# Patient Record
Sex: Female | Born: 1955 | Race: White | Hispanic: No | Marital: Married | Smoking: Never smoker
Health system: Southern US, Community
[De-identification: ages and names within clinical notes are randomized; demographics above are authoritative.]

## PROBLEM LIST (undated history)

## (undated) DIAGNOSIS — F419 Anxiety disorder, unspecified: Secondary | ICD-10-CM

## (undated) DIAGNOSIS — M8580 Other specified disorders of bone density and structure, unspecified site: Secondary | ICD-10-CM

## (undated) DIAGNOSIS — M1990 Unspecified osteoarthritis, unspecified site: Secondary | ICD-10-CM

## (undated) DIAGNOSIS — R011 Cardiac murmur, unspecified: Secondary | ICD-10-CM

## (undated) HISTORY — DX: Gilbert syndrome: E80.4

## (undated) HISTORY — PX: POLYPECTOMY: SHX149

## (undated) HISTORY — PX: BREAST BIOPSY: SHX20

## (undated) HISTORY — PX: COLONOSCOPY: SHX174

## (undated) HISTORY — DX: Other specified disorders of bone density and structure, unspecified site: M85.80

## (undated) HISTORY — DX: Anxiety disorder, unspecified: F41.9

## (undated) HISTORY — DX: Unspecified osteoarthritis, unspecified site: M19.90

## (undated) HISTORY — DX: Cardiac murmur, unspecified: R01.1

## (undated) HISTORY — PX: KNEE SURGERY: SHX244

---

## 1999-03-01 ENCOUNTER — Other Ambulatory Visit: Admission: RE | Admit: 1999-03-01 | Discharge: 1999-03-01 | Payer: Self-pay | Admitting: Obstetrics & Gynecology

## 2000-07-25 ENCOUNTER — Encounter: Admission: RE | Admit: 2000-07-25 | Discharge: 2000-08-16 | Payer: Self-pay | Admitting: Specialist

## 2000-07-26 ENCOUNTER — Other Ambulatory Visit: Admission: RE | Admit: 2000-07-26 | Discharge: 2000-07-26 | Payer: Self-pay | Admitting: Obstetrics & Gynecology

## 2001-10-02 ENCOUNTER — Other Ambulatory Visit: Admission: RE | Admit: 2001-10-02 | Discharge: 2001-10-02 | Payer: Self-pay | Admitting: Obstetrics & Gynecology

## 2001-10-11 ENCOUNTER — Encounter: Admission: RE | Admit: 2001-10-11 | Discharge: 2001-10-11 | Payer: Self-pay | Admitting: Surgery

## 2001-10-11 ENCOUNTER — Encounter (INDEPENDENT_AMBULATORY_CARE_PROVIDER_SITE_OTHER): Payer: Self-pay | Admitting: *Deleted

## 2001-10-11 ENCOUNTER — Encounter: Payer: Self-pay | Admitting: Surgery

## 2002-01-15 ENCOUNTER — Encounter: Payer: Self-pay | Admitting: Family Medicine

## 2002-01-15 ENCOUNTER — Encounter: Admission: RE | Admit: 2002-01-15 | Discharge: 2002-01-15 | Payer: Self-pay | Admitting: Family Medicine

## 2002-04-15 ENCOUNTER — Encounter: Payer: Self-pay | Admitting: Obstetrics & Gynecology

## 2002-04-15 ENCOUNTER — Encounter: Admission: RE | Admit: 2002-04-15 | Discharge: 2002-04-15 | Payer: Self-pay | Admitting: Obstetrics & Gynecology

## 2002-08-29 ENCOUNTER — Other Ambulatory Visit: Admission: RE | Admit: 2002-08-29 | Discharge: 2002-08-29 | Payer: Self-pay | Admitting: Family Medicine

## 2003-04-20 ENCOUNTER — Encounter: Admission: RE | Admit: 2003-04-20 | Discharge: 2003-04-20 | Payer: Self-pay | Admitting: Obstetrics & Gynecology

## 2003-04-20 ENCOUNTER — Encounter: Payer: Self-pay | Admitting: Obstetrics & Gynecology

## 2003-08-31 ENCOUNTER — Other Ambulatory Visit: Admission: RE | Admit: 2003-08-31 | Discharge: 2003-08-31 | Payer: Self-pay | Admitting: Family Medicine

## 2003-12-02 ENCOUNTER — Other Ambulatory Visit: Admission: RE | Admit: 2003-12-02 | Discharge: 2003-12-02 | Payer: Self-pay | Admitting: Obstetrics & Gynecology

## 2004-04-20 ENCOUNTER — Encounter: Admission: RE | Admit: 2004-04-20 | Discharge: 2004-04-20 | Payer: Self-pay | Admitting: Family Medicine

## 2005-02-16 ENCOUNTER — Other Ambulatory Visit: Admission: RE | Admit: 2005-02-16 | Discharge: 2005-02-16 | Payer: Self-pay | Admitting: Obstetrics and Gynecology

## 2005-03-30 ENCOUNTER — Ambulatory Visit: Payer: Self-pay | Admitting: Family Medicine

## 2005-05-01 ENCOUNTER — Encounter: Admission: RE | Admit: 2005-05-01 | Discharge: 2005-05-01 | Payer: Self-pay | Admitting: Obstetrics and Gynecology

## 2006-03-12 ENCOUNTER — Other Ambulatory Visit: Admission: RE | Admit: 2006-03-12 | Discharge: 2006-03-12 | Payer: Self-pay | Admitting: Obstetrics and Gynecology

## 2006-05-02 ENCOUNTER — Encounter: Admission: RE | Admit: 2006-05-02 | Discharge: 2006-05-02 | Payer: Self-pay | Admitting: Internal Medicine

## 2007-05-06 ENCOUNTER — Encounter: Admission: RE | Admit: 2007-05-06 | Discharge: 2007-05-06 | Payer: Self-pay | Admitting: Internal Medicine

## 2007-05-08 ENCOUNTER — Encounter: Admission: RE | Admit: 2007-05-08 | Discharge: 2007-05-08 | Payer: Self-pay | Admitting: Internal Medicine

## 2007-12-13 ENCOUNTER — Encounter: Admission: RE | Admit: 2007-12-13 | Discharge: 2007-12-13 | Payer: Self-pay | Admitting: Obstetrics and Gynecology

## 2008-05-27 ENCOUNTER — Encounter: Admission: RE | Admit: 2008-05-27 | Discharge: 2008-05-27 | Payer: Self-pay | Admitting: Internal Medicine

## 2008-06-23 ENCOUNTER — Ambulatory Visit: Payer: Self-pay | Admitting: Internal Medicine

## 2008-07-06 ENCOUNTER — Ambulatory Visit: Payer: Self-pay | Admitting: Internal Medicine

## 2009-06-14 ENCOUNTER — Encounter: Admission: RE | Admit: 2009-06-14 | Discharge: 2009-06-14 | Payer: Self-pay | Admitting: Internal Medicine

## 2010-06-15 ENCOUNTER — Encounter: Admission: RE | Admit: 2010-06-15 | Discharge: 2010-06-15 | Payer: Self-pay | Admitting: Internal Medicine

## 2011-01-08 ENCOUNTER — Encounter: Payer: Self-pay | Admitting: Internal Medicine

## 2011-04-04 ENCOUNTER — Other Ambulatory Visit: Payer: Self-pay | Admitting: Internal Medicine

## 2011-04-04 DIAGNOSIS — Z1231 Encounter for screening mammogram for malignant neoplasm of breast: Secondary | ICD-10-CM

## 2011-06-19 ENCOUNTER — Ambulatory Visit
Admission: RE | Admit: 2011-06-19 | Discharge: 2011-06-19 | Disposition: A | Discharge status: Home / Self Care | Payer: BC Managed Care – PPO | Source: Ambulatory Visit | Attending: Internal Medicine | Admitting: Internal Medicine

## 2011-06-19 DIAGNOSIS — Z1231 Encounter for screening mammogram for malignant neoplasm of breast: Secondary | ICD-10-CM

## 2012-04-01 ENCOUNTER — Other Ambulatory Visit: Payer: Self-pay | Admitting: Internal Medicine

## 2012-04-01 DIAGNOSIS — Z1231 Encounter for screening mammogram for malignant neoplasm of breast: Secondary | ICD-10-CM

## 2012-06-19 ENCOUNTER — Ambulatory Visit: Payer: BC Managed Care – PPO

## 2012-06-24 ENCOUNTER — Ambulatory Visit
Admission: RE | Admit: 2012-06-24 | Discharge: 2012-06-24 | Disposition: A | Discharge status: Home / Self Care | Payer: BC Managed Care – PPO | Source: Ambulatory Visit | Attending: Internal Medicine | Admitting: Internal Medicine

## 2012-06-24 DIAGNOSIS — Z1231 Encounter for screening mammogram for malignant neoplasm of breast: Secondary | ICD-10-CM

## 2013-05-19 ENCOUNTER — Other Ambulatory Visit: Payer: Self-pay

## 2013-05-19 DIAGNOSIS — Z1231 Encounter for screening mammogram for malignant neoplasm of breast: Secondary | ICD-10-CM

## 2013-06-25 ENCOUNTER — Ambulatory Visit
Admission: RE | Admit: 2013-06-25 | Discharge: 2013-06-25 | Disposition: A | Discharge status: Home / Self Care | Payer: BC Managed Care – PPO | Source: Ambulatory Visit

## 2013-06-25 DIAGNOSIS — Z1231 Encounter for screening mammogram for malignant neoplasm of breast: Secondary | ICD-10-CM

## 2014-03-31 ENCOUNTER — Other Ambulatory Visit: Payer: Self-pay

## 2014-03-31 DIAGNOSIS — Z1231 Encounter for screening mammogram for malignant neoplasm of breast: Secondary | ICD-10-CM

## 2014-06-29 ENCOUNTER — Ambulatory Visit
Admission: RE | Admit: 2014-06-29 | Discharge: 2014-06-29 | Disposition: A | Discharge status: Home / Self Care | Payer: BC Managed Care – PPO | Source: Ambulatory Visit

## 2014-06-29 DIAGNOSIS — Z1231 Encounter for screening mammogram for malignant neoplasm of breast: Secondary | ICD-10-CM

## 2014-08-25 ENCOUNTER — Encounter: Payer: Self-pay | Admitting: Internal Medicine

## 2015-03-16 ENCOUNTER — Other Ambulatory Visit: Payer: Self-pay

## 2015-03-16 DIAGNOSIS — Z1231 Encounter for screening mammogram for malignant neoplasm of breast: Secondary | ICD-10-CM

## 2015-07-05 ENCOUNTER — Ambulatory Visit: Payer: Self-pay

## 2015-07-06 ENCOUNTER — Ambulatory Visit
Admission: RE | Admit: 2015-07-06 | Discharge: 2015-07-06 | Disposition: A | Discharge status: Home / Self Care | Payer: BLUE CROSS/BLUE SHIELD | Source: Ambulatory Visit

## 2015-07-06 DIAGNOSIS — Z1231 Encounter for screening mammogram for malignant neoplasm of breast: Secondary | ICD-10-CM

## 2016-05-12 DIAGNOSIS — L821 Other seborrheic keratosis: Secondary | ICD-10-CM | POA: Diagnosis not present

## 2016-05-12 DIAGNOSIS — L570 Actinic keratosis: Secondary | ICD-10-CM | POA: Diagnosis not present

## 2016-05-12 DIAGNOSIS — D2261 Melanocytic nevi of right upper limb, including shoulder: Secondary | ICD-10-CM | POA: Diagnosis not present

## 2016-05-12 DIAGNOSIS — D1801 Hemangioma of skin and subcutaneous tissue: Secondary | ICD-10-CM | POA: Diagnosis not present

## 2016-05-12 DIAGNOSIS — L738 Other specified follicular disorders: Secondary | ICD-10-CM | POA: Diagnosis not present

## 2016-05-29 DIAGNOSIS — Z23 Encounter for immunization: Secondary | ICD-10-CM | POA: Diagnosis not present

## 2016-06-21 ENCOUNTER — Other Ambulatory Visit: Payer: Self-pay | Admitting: Obstetrics and Gynecology

## 2016-06-21 DIAGNOSIS — Z1231 Encounter for screening mammogram for malignant neoplasm of breast: Secondary | ICD-10-CM

## 2016-06-29 ENCOUNTER — Other Ambulatory Visit: Payer: Self-pay | Admitting: Obstetrics and Gynecology

## 2016-06-29 DIAGNOSIS — N644 Mastodynia: Secondary | ICD-10-CM

## 2016-07-06 ENCOUNTER — Ambulatory Visit
Admission: RE | Admit: 2016-07-06 | Discharge: 2016-07-06 | Disposition: A | Discharge status: Home / Self Care | Payer: BLUE CROSS/BLUE SHIELD | Source: Ambulatory Visit | Attending: Obstetrics and Gynecology | Admitting: Obstetrics and Gynecology

## 2016-07-06 DIAGNOSIS — N63 Unspecified lump in breast: Secondary | ICD-10-CM | POA: Diagnosis not present

## 2016-07-06 DIAGNOSIS — N644 Mastodynia: Secondary | ICD-10-CM

## 2016-07-06 DIAGNOSIS — N6001 Solitary cyst of right breast: Secondary | ICD-10-CM | POA: Diagnosis not present

## 2016-07-17 ENCOUNTER — Ambulatory Visit: Payer: BLUE CROSS/BLUE SHIELD

## 2016-09-22 DIAGNOSIS — D1801 Hemangioma of skin and subcutaneous tissue: Secondary | ICD-10-CM | POA: Diagnosis not present

## 2016-12-20 DIAGNOSIS — R3129 Other microscopic hematuria: Secondary | ICD-10-CM | POA: Diagnosis not present

## 2016-12-20 DIAGNOSIS — E784 Other hyperlipidemia: Secondary | ICD-10-CM | POA: Diagnosis not present

## 2016-12-20 DIAGNOSIS — M859 Disorder of bone density and structure, unspecified: Secondary | ICD-10-CM | POA: Diagnosis not present

## 2016-12-20 DIAGNOSIS — Z0000 Encounter for general adult medical examination without abnormal findings: Secondary | ICD-10-CM | POA: Diagnosis not present

## 2016-12-25 DIAGNOSIS — E784 Other hyperlipidemia: Secondary | ICD-10-CM | POA: Diagnosis not present

## 2016-12-25 DIAGNOSIS — M859 Disorder of bone density and structure, unspecified: Secondary | ICD-10-CM | POA: Diagnosis not present

## 2016-12-25 DIAGNOSIS — M79671 Pain in right foot: Secondary | ICD-10-CM | POA: Diagnosis not present

## 2016-12-25 DIAGNOSIS — Z0000 Encounter for general adult medical examination without abnormal findings: Secondary | ICD-10-CM | POA: Diagnosis not present

## 2016-12-25 DIAGNOSIS — Z1389 Encounter for screening for other disorder: Secondary | ICD-10-CM | POA: Diagnosis not present

## 2016-12-25 DIAGNOSIS — Z23 Encounter for immunization: Secondary | ICD-10-CM | POA: Diagnosis not present

## 2016-12-25 DIAGNOSIS — R011 Cardiac murmur, unspecified: Secondary | ICD-10-CM | POA: Diagnosis not present

## 2017-01-04 DIAGNOSIS — Z1212 Encounter for screening for malignant neoplasm of rectum: Secondary | ICD-10-CM | POA: Diagnosis not present

## 2017-01-25 DIAGNOSIS — H5203 Hypermetropia, bilateral: Secondary | ICD-10-CM | POA: Diagnosis not present

## 2017-02-08 DIAGNOSIS — Z01419 Encounter for gynecological examination (general) (routine) without abnormal findings: Secondary | ICD-10-CM | POA: Diagnosis not present

## 2017-02-08 DIAGNOSIS — Z6823 Body mass index (BMI) 23.0-23.9, adult: Secondary | ICD-10-CM | POA: Diagnosis not present

## 2017-03-19 DIAGNOSIS — M9903 Segmental and somatic dysfunction of lumbar region: Secondary | ICD-10-CM | POA: Diagnosis not present

## 2017-03-19 DIAGNOSIS — M25552 Pain in left hip: Secondary | ICD-10-CM | POA: Diagnosis not present

## 2017-03-21 DIAGNOSIS — M5137 Other intervertebral disc degeneration, lumbosacral region: Secondary | ICD-10-CM | POA: Diagnosis not present

## 2017-03-21 DIAGNOSIS — M9903 Segmental and somatic dysfunction of lumbar region: Secondary | ICD-10-CM | POA: Diagnosis not present

## 2017-03-21 DIAGNOSIS — M461 Sacroiliitis, not elsewhere classified: Secondary | ICD-10-CM | POA: Diagnosis not present

## 2017-03-21 DIAGNOSIS — M9905 Segmental and somatic dysfunction of pelvic region: Secondary | ICD-10-CM | POA: Diagnosis not present

## 2017-03-22 DIAGNOSIS — M461 Sacroiliitis, not elsewhere classified: Secondary | ICD-10-CM | POA: Diagnosis not present

## 2017-03-22 DIAGNOSIS — M9903 Segmental and somatic dysfunction of lumbar region: Secondary | ICD-10-CM | POA: Diagnosis not present

## 2017-03-22 DIAGNOSIS — M9905 Segmental and somatic dysfunction of pelvic region: Secondary | ICD-10-CM | POA: Diagnosis not present

## 2017-03-22 DIAGNOSIS — M5137 Other intervertebral disc degeneration, lumbosacral region: Secondary | ICD-10-CM | POA: Diagnosis not present

## 2017-03-26 DIAGNOSIS — M461 Sacroiliitis, not elsewhere classified: Secondary | ICD-10-CM | POA: Diagnosis not present

## 2017-03-26 DIAGNOSIS — M5137 Other intervertebral disc degeneration, lumbosacral region: Secondary | ICD-10-CM | POA: Diagnosis not present

## 2017-03-26 DIAGNOSIS — M9903 Segmental and somatic dysfunction of lumbar region: Secondary | ICD-10-CM | POA: Diagnosis not present

## 2017-03-26 DIAGNOSIS — M9905 Segmental and somatic dysfunction of pelvic region: Secondary | ICD-10-CM | POA: Diagnosis not present

## 2017-03-27 DIAGNOSIS — M5137 Other intervertebral disc degeneration, lumbosacral region: Secondary | ICD-10-CM | POA: Diagnosis not present

## 2017-03-27 DIAGNOSIS — M9903 Segmental and somatic dysfunction of lumbar region: Secondary | ICD-10-CM | POA: Diagnosis not present

## 2017-03-27 DIAGNOSIS — M461 Sacroiliitis, not elsewhere classified: Secondary | ICD-10-CM | POA: Diagnosis not present

## 2017-03-27 DIAGNOSIS — M9905 Segmental and somatic dysfunction of pelvic region: Secondary | ICD-10-CM | POA: Diagnosis not present

## 2017-03-29 DIAGNOSIS — M5137 Other intervertebral disc degeneration, lumbosacral region: Secondary | ICD-10-CM | POA: Diagnosis not present

## 2017-03-29 DIAGNOSIS — M461 Sacroiliitis, not elsewhere classified: Secondary | ICD-10-CM | POA: Diagnosis not present

## 2017-03-29 DIAGNOSIS — M9905 Segmental and somatic dysfunction of pelvic region: Secondary | ICD-10-CM | POA: Diagnosis not present

## 2017-03-29 DIAGNOSIS — M9903 Segmental and somatic dysfunction of lumbar region: Secondary | ICD-10-CM | POA: Diagnosis not present

## 2017-04-02 DIAGNOSIS — M5137 Other intervertebral disc degeneration, lumbosacral region: Secondary | ICD-10-CM | POA: Diagnosis not present

## 2017-04-02 DIAGNOSIS — M9905 Segmental and somatic dysfunction of pelvic region: Secondary | ICD-10-CM | POA: Diagnosis not present

## 2017-04-02 DIAGNOSIS — M461 Sacroiliitis, not elsewhere classified: Secondary | ICD-10-CM | POA: Diagnosis not present

## 2017-04-02 DIAGNOSIS — M9903 Segmental and somatic dysfunction of lumbar region: Secondary | ICD-10-CM | POA: Diagnosis not present

## 2017-04-03 DIAGNOSIS — M461 Sacroiliitis, not elsewhere classified: Secondary | ICD-10-CM | POA: Diagnosis not present

## 2017-04-03 DIAGNOSIS — M5137 Other intervertebral disc degeneration, lumbosacral region: Secondary | ICD-10-CM | POA: Diagnosis not present

## 2017-04-03 DIAGNOSIS — M9905 Segmental and somatic dysfunction of pelvic region: Secondary | ICD-10-CM | POA: Diagnosis not present

## 2017-04-03 DIAGNOSIS — M9903 Segmental and somatic dysfunction of lumbar region: Secondary | ICD-10-CM | POA: Diagnosis not present

## 2017-04-05 DIAGNOSIS — M9903 Segmental and somatic dysfunction of lumbar region: Secondary | ICD-10-CM | POA: Diagnosis not present

## 2017-04-05 DIAGNOSIS — M9905 Segmental and somatic dysfunction of pelvic region: Secondary | ICD-10-CM | POA: Diagnosis not present

## 2017-04-05 DIAGNOSIS — M5137 Other intervertebral disc degeneration, lumbosacral region: Secondary | ICD-10-CM | POA: Diagnosis not present

## 2017-04-05 DIAGNOSIS — M461 Sacroiliitis, not elsewhere classified: Secondary | ICD-10-CM | POA: Diagnosis not present

## 2017-04-09 DIAGNOSIS — M5137 Other intervertebral disc degeneration, lumbosacral region: Secondary | ICD-10-CM | POA: Diagnosis not present

## 2017-04-09 DIAGNOSIS — M9903 Segmental and somatic dysfunction of lumbar region: Secondary | ICD-10-CM | POA: Diagnosis not present

## 2017-04-09 DIAGNOSIS — M461 Sacroiliitis, not elsewhere classified: Secondary | ICD-10-CM | POA: Diagnosis not present

## 2017-04-09 DIAGNOSIS — M9905 Segmental and somatic dysfunction of pelvic region: Secondary | ICD-10-CM | POA: Diagnosis not present

## 2017-04-10 DIAGNOSIS — M9905 Segmental and somatic dysfunction of pelvic region: Secondary | ICD-10-CM | POA: Diagnosis not present

## 2017-04-10 DIAGNOSIS — M9903 Segmental and somatic dysfunction of lumbar region: Secondary | ICD-10-CM | POA: Diagnosis not present

## 2017-04-10 DIAGNOSIS — M461 Sacroiliitis, not elsewhere classified: Secondary | ICD-10-CM | POA: Diagnosis not present

## 2017-04-10 DIAGNOSIS — M5137 Other intervertebral disc degeneration, lumbosacral region: Secondary | ICD-10-CM | POA: Diagnosis not present

## 2017-04-12 DIAGNOSIS — M5137 Other intervertebral disc degeneration, lumbosacral region: Secondary | ICD-10-CM | POA: Diagnosis not present

## 2017-04-12 DIAGNOSIS — M9905 Segmental and somatic dysfunction of pelvic region: Secondary | ICD-10-CM | POA: Diagnosis not present

## 2017-04-12 DIAGNOSIS — M9903 Segmental and somatic dysfunction of lumbar region: Secondary | ICD-10-CM | POA: Diagnosis not present

## 2017-04-12 DIAGNOSIS — M461 Sacroiliitis, not elsewhere classified: Secondary | ICD-10-CM | POA: Diagnosis not present

## 2017-04-18 DIAGNOSIS — M461 Sacroiliitis, not elsewhere classified: Secondary | ICD-10-CM | POA: Diagnosis not present

## 2017-04-18 DIAGNOSIS — M9903 Segmental and somatic dysfunction of lumbar region: Secondary | ICD-10-CM | POA: Diagnosis not present

## 2017-04-18 DIAGNOSIS — M9905 Segmental and somatic dysfunction of pelvic region: Secondary | ICD-10-CM | POA: Diagnosis not present

## 2017-04-18 DIAGNOSIS — M5137 Other intervertebral disc degeneration, lumbosacral region: Secondary | ICD-10-CM | POA: Diagnosis not present

## 2017-04-18 DIAGNOSIS — M47816 Spondylosis without myelopathy or radiculopathy, lumbar region: Secondary | ICD-10-CM | POA: Diagnosis not present

## 2017-04-23 DIAGNOSIS — M9903 Segmental and somatic dysfunction of lumbar region: Secondary | ICD-10-CM | POA: Diagnosis not present

## 2017-04-23 DIAGNOSIS — M461 Sacroiliitis, not elsewhere classified: Secondary | ICD-10-CM | POA: Diagnosis not present

## 2017-04-23 DIAGNOSIS — M5137 Other intervertebral disc degeneration, lumbosacral region: Secondary | ICD-10-CM | POA: Diagnosis not present

## 2017-04-23 DIAGNOSIS — M9905 Segmental and somatic dysfunction of pelvic region: Secondary | ICD-10-CM | POA: Diagnosis not present

## 2017-05-02 DIAGNOSIS — M9903 Segmental and somatic dysfunction of lumbar region: Secondary | ICD-10-CM | POA: Diagnosis not present

## 2017-05-02 DIAGNOSIS — M9905 Segmental and somatic dysfunction of pelvic region: Secondary | ICD-10-CM | POA: Diagnosis not present

## 2017-05-02 DIAGNOSIS — M5137 Other intervertebral disc degeneration, lumbosacral region: Secondary | ICD-10-CM | POA: Diagnosis not present

## 2017-05-02 DIAGNOSIS — M461 Sacroiliitis, not elsewhere classified: Secondary | ICD-10-CM | POA: Diagnosis not present

## 2017-05-08 DIAGNOSIS — M461 Sacroiliitis, not elsewhere classified: Secondary | ICD-10-CM | POA: Diagnosis not present

## 2017-05-08 DIAGNOSIS — M9905 Segmental and somatic dysfunction of pelvic region: Secondary | ICD-10-CM | POA: Diagnosis not present

## 2017-05-08 DIAGNOSIS — M9903 Segmental and somatic dysfunction of lumbar region: Secondary | ICD-10-CM | POA: Diagnosis not present

## 2017-05-08 DIAGNOSIS — M5137 Other intervertebral disc degeneration, lumbosacral region: Secondary | ICD-10-CM | POA: Diagnosis not present

## 2017-05-17 DIAGNOSIS — M9905 Segmental and somatic dysfunction of pelvic region: Secondary | ICD-10-CM | POA: Diagnosis not present

## 2017-05-17 DIAGNOSIS — M461 Sacroiliitis, not elsewhere classified: Secondary | ICD-10-CM | POA: Diagnosis not present

## 2017-05-17 DIAGNOSIS — M5137 Other intervertebral disc degeneration, lumbosacral region: Secondary | ICD-10-CM | POA: Diagnosis not present

## 2017-05-17 DIAGNOSIS — M9903 Segmental and somatic dysfunction of lumbar region: Secondary | ICD-10-CM | POA: Diagnosis not present

## 2017-05-18 DIAGNOSIS — L218 Other seborrheic dermatitis: Secondary | ICD-10-CM | POA: Diagnosis not present

## 2017-05-18 DIAGNOSIS — D1801 Hemangioma of skin and subcutaneous tissue: Secondary | ICD-10-CM | POA: Diagnosis not present

## 2017-05-18 DIAGNOSIS — L821 Other seborrheic keratosis: Secondary | ICD-10-CM | POA: Diagnosis not present

## 2017-05-18 DIAGNOSIS — L738 Other specified follicular disorders: Secondary | ICD-10-CM | POA: Diagnosis not present

## 2017-05-28 ENCOUNTER — Other Ambulatory Visit: Payer: Self-pay | Admitting: Obstetrics and Gynecology

## 2017-05-28 DIAGNOSIS — Z1231 Encounter for screening mammogram for malignant neoplasm of breast: Secondary | ICD-10-CM

## 2017-05-31 DIAGNOSIS — M5137 Other intervertebral disc degeneration, lumbosacral region: Secondary | ICD-10-CM | POA: Diagnosis not present

## 2017-05-31 DIAGNOSIS — M9903 Segmental and somatic dysfunction of lumbar region: Secondary | ICD-10-CM | POA: Diagnosis not present

## 2017-05-31 DIAGNOSIS — M9905 Segmental and somatic dysfunction of pelvic region: Secondary | ICD-10-CM | POA: Diagnosis not present

## 2017-05-31 DIAGNOSIS — M461 Sacroiliitis, not elsewhere classified: Secondary | ICD-10-CM | POA: Diagnosis not present

## 2017-06-14 DIAGNOSIS — M461 Sacroiliitis, not elsewhere classified: Secondary | ICD-10-CM | POA: Diagnosis not present

## 2017-06-14 DIAGNOSIS — M9903 Segmental and somatic dysfunction of lumbar region: Secondary | ICD-10-CM | POA: Diagnosis not present

## 2017-06-14 DIAGNOSIS — M5137 Other intervertebral disc degeneration, lumbosacral region: Secondary | ICD-10-CM | POA: Diagnosis not present

## 2017-06-14 DIAGNOSIS — M9905 Segmental and somatic dysfunction of pelvic region: Secondary | ICD-10-CM | POA: Diagnosis not present

## 2017-07-04 DIAGNOSIS — M9905 Segmental and somatic dysfunction of pelvic region: Secondary | ICD-10-CM | POA: Diagnosis not present

## 2017-07-04 DIAGNOSIS — M461 Sacroiliitis, not elsewhere classified: Secondary | ICD-10-CM | POA: Diagnosis not present

## 2017-07-04 DIAGNOSIS — M9903 Segmental and somatic dysfunction of lumbar region: Secondary | ICD-10-CM | POA: Diagnosis not present

## 2017-07-04 DIAGNOSIS — M5137 Other intervertebral disc degeneration, lumbosacral region: Secondary | ICD-10-CM | POA: Diagnosis not present

## 2017-07-09 ENCOUNTER — Encounter (INDEPENDENT_AMBULATORY_CARE_PROVIDER_SITE_OTHER): Payer: Self-pay

## 2017-07-09 ENCOUNTER — Ambulatory Visit
Admission: RE | Admit: 2017-07-09 | Discharge: 2017-07-09 | Disposition: A | Discharge status: Home / Self Care | Payer: BLUE CROSS/BLUE SHIELD | Source: Ambulatory Visit | Attending: Obstetrics and Gynecology | Admitting: Obstetrics and Gynecology

## 2017-07-09 ENCOUNTER — Ambulatory Visit: Payer: BLUE CROSS/BLUE SHIELD

## 2017-07-09 DIAGNOSIS — Z1231 Encounter for screening mammogram for malignant neoplasm of breast: Secondary | ICD-10-CM | POA: Diagnosis not present

## 2017-12-17 ENCOUNTER — Ambulatory Visit (INDEPENDENT_AMBULATORY_CARE_PROVIDER_SITE_OTHER): Payer: Self-pay | Admitting: Orthopaedic Surgery

## 2017-12-21 DIAGNOSIS — L918 Other hypertrophic disorders of the skin: Secondary | ICD-10-CM | POA: Diagnosis not present

## 2017-12-21 DIAGNOSIS — L720 Epidermal cyst: Secondary | ICD-10-CM | POA: Diagnosis not present

## 2017-12-21 DIAGNOSIS — L820 Inflamed seborrheic keratosis: Secondary | ICD-10-CM | POA: Diagnosis not present

## 2017-12-21 DIAGNOSIS — L570 Actinic keratosis: Secondary | ICD-10-CM | POA: Diagnosis not present

## 2018-01-02 DIAGNOSIS — M79671 Pain in right foot: Secondary | ICD-10-CM | POA: Diagnosis not present

## 2018-01-02 DIAGNOSIS — M722 Plantar fascial fibromatosis: Secondary | ICD-10-CM | POA: Diagnosis not present

## 2018-01-04 DIAGNOSIS — M859 Disorder of bone density and structure, unspecified: Secondary | ICD-10-CM | POA: Diagnosis not present

## 2018-01-04 DIAGNOSIS — Z0000 Encounter for general adult medical examination without abnormal findings: Secondary | ICD-10-CM | POA: Diagnosis not present

## 2018-01-04 DIAGNOSIS — E7849 Other hyperlipidemia: Secondary | ICD-10-CM | POA: Diagnosis not present

## 2018-01-04 DIAGNOSIS — R82998 Other abnormal findings in urine: Secondary | ICD-10-CM | POA: Diagnosis not present

## 2018-01-04 DIAGNOSIS — E785 Hyperlipidemia, unspecified: Secondary | ICD-10-CM | POA: Diagnosis not present

## 2018-01-09 DIAGNOSIS — R05 Cough: Secondary | ICD-10-CM | POA: Diagnosis not present

## 2018-01-10 DIAGNOSIS — R0602 Shortness of breath: Secondary | ICD-10-CM | POA: Diagnosis not present

## 2018-01-10 DIAGNOSIS — Z0000 Encounter for general adult medical examination without abnormal findings: Secondary | ICD-10-CM | POA: Diagnosis not present

## 2018-01-10 DIAGNOSIS — Z1389 Encounter for screening for other disorder: Secondary | ICD-10-CM | POA: Diagnosis not present

## 2018-01-10 DIAGNOSIS — R05 Cough: Secondary | ICD-10-CM | POA: Diagnosis not present

## 2018-01-10 DIAGNOSIS — M859 Disorder of bone density and structure, unspecified: Secondary | ICD-10-CM | POA: Diagnosis not present

## 2018-01-10 DIAGNOSIS — R531 Weakness: Secondary | ICD-10-CM | POA: Diagnosis not present

## 2018-01-15 DIAGNOSIS — M722 Plantar fascial fibromatosis: Secondary | ICD-10-CM | POA: Diagnosis not present

## 2018-01-15 DIAGNOSIS — M79671 Pain in right foot: Secondary | ICD-10-CM | POA: Diagnosis not present

## 2018-01-18 DIAGNOSIS — Z1212 Encounter for screening for malignant neoplasm of rectum: Secondary | ICD-10-CM | POA: Diagnosis not present

## 2018-02-01 DIAGNOSIS — H5203 Hypermetropia, bilateral: Secondary | ICD-10-CM | POA: Diagnosis not present

## 2018-02-18 DIAGNOSIS — Z6822 Body mass index (BMI) 22.0-22.9, adult: Secondary | ICD-10-CM | POA: Diagnosis not present

## 2018-02-18 DIAGNOSIS — Z124 Encounter for screening for malignant neoplasm of cervix: Secondary | ICD-10-CM | POA: Diagnosis not present

## 2018-02-18 DIAGNOSIS — Z01419 Encounter for gynecological examination (general) (routine) without abnormal findings: Secondary | ICD-10-CM | POA: Diagnosis not present

## 2018-02-19 DIAGNOSIS — Z124 Encounter for screening for malignant neoplasm of cervix: Secondary | ICD-10-CM | POA: Diagnosis not present

## 2018-04-23 ENCOUNTER — Encounter: Payer: Self-pay | Admitting: Gastroenterology

## 2018-06-07 ENCOUNTER — Other Ambulatory Visit: Payer: Self-pay | Admitting: Internal Medicine

## 2018-06-07 DIAGNOSIS — Z1231 Encounter for screening mammogram for malignant neoplasm of breast: Secondary | ICD-10-CM

## 2018-06-25 ENCOUNTER — Ambulatory Visit (AMBULATORY_SURGERY_CENTER): Payer: Self-pay

## 2018-06-25 VITALS — Ht 62.0 in | Wt 125.8 lb

## 2018-06-25 DIAGNOSIS — Z1211 Encounter for screening for malignant neoplasm of colon: Secondary | ICD-10-CM

## 2018-06-25 MED ORDER — NA SULFATE-K SULFATE-MG SULF 17.5-3.13-1.6 GM/177ML PO SOLN: 1.0000 | Freq: Once | ORAL | 0 refills | Status: AC

## 2018-06-25 NOTE — Progress Notes (Signed)
Per pt, no allergies to soy or egg products.Pt not taking any weight loss meds or using  O2 at home.  Pt refused emmi video. 

## 2018-07-01 ENCOUNTER — Encounter: Payer: Self-pay | Admitting: Gastroenterology

## 2018-07-04 DIAGNOSIS — D225 Melanocytic nevi of trunk: Secondary | ICD-10-CM | POA: Diagnosis not present

## 2018-07-04 DIAGNOSIS — L821 Other seborrheic keratosis: Secondary | ICD-10-CM | POA: Diagnosis not present

## 2018-07-04 DIAGNOSIS — L570 Actinic keratosis: Secondary | ICD-10-CM | POA: Diagnosis not present

## 2018-07-04 DIAGNOSIS — B078 Other viral warts: Secondary | ICD-10-CM | POA: Diagnosis not present

## 2018-07-04 DIAGNOSIS — D2272 Melanocytic nevi of left lower limb, including hip: Secondary | ICD-10-CM | POA: Diagnosis not present

## 2018-07-04 DIAGNOSIS — D1801 Hemangioma of skin and subcutaneous tissue: Secondary | ICD-10-CM | POA: Diagnosis not present

## 2018-07-08 ENCOUNTER — Encounter: Payer: BLUE CROSS/BLUE SHIELD | Admitting: Gastroenterology

## 2018-07-11 ENCOUNTER — Ambulatory Visit
Admission: RE | Admit: 2018-07-11 | Discharge: 2018-07-11 | Disposition: A | Discharge status: Home / Self Care | Payer: BLUE CROSS/BLUE SHIELD | Source: Ambulatory Visit | Attending: Internal Medicine | Admitting: Internal Medicine

## 2018-07-11 DIAGNOSIS — Z1231 Encounter for screening mammogram for malignant neoplasm of breast: Secondary | ICD-10-CM | POA: Diagnosis not present

## 2018-07-15 ENCOUNTER — Ambulatory Visit: Payer: BLUE CROSS/BLUE SHIELD

## 2018-09-12 ENCOUNTER — Encounter: Payer: Self-pay | Admitting: Gastroenterology

## 2018-10-14 ENCOUNTER — Encounter: Payer: Self-pay | Admitting: Gastroenterology

## 2018-10-14 ENCOUNTER — Ambulatory Visit (AMBULATORY_SURGERY_CENTER): Payer: Self-pay

## 2018-10-14 VITALS — Ht 62.0 in | Wt 125.8 lb

## 2018-10-14 DIAGNOSIS — Z1211 Encounter for screening for malignant neoplasm of colon: Secondary | ICD-10-CM

## 2018-10-14 MED ORDER — NA SULFATE-K SULFATE-MG SULF 17.5-3.13-1.6 GM/177ML PO SOLN: 1.0000 | Freq: Once | ORAL | 0 refills | Status: AC

## 2018-10-14 NOTE — Progress Notes (Signed)
Denies allergies to eggs or soy products. Denies complication of anesthesia or sedation. Denies use of weight loss medication. Denies use of O2.   Emmi instructions declined.   Patient was instructed to drink plenty of fluids ( 8 glasses or more ) prior to her procedure so that her prep will work more efficiently, it will prevent dehydration, it will help [place] get an IV started. Patient verbalizes understanding. Patient already had Suprep because she had to cancel her appointment last July.

## 2018-10-28 ENCOUNTER — Encounter: Payer: Self-pay | Admitting: Gastroenterology

## 2018-10-28 ENCOUNTER — Ambulatory Visit (AMBULATORY_SURGERY_CENTER): Payer: BLUE CROSS/BLUE SHIELD | Admitting: Gastroenterology

## 2018-10-28 ENCOUNTER — Other Ambulatory Visit: Payer: Self-pay

## 2018-10-28 VITALS — BP 103/56 | HR 65 | Temp 98.6°F | Resp 9 | Ht 62.0 in | Wt 125.0 lb

## 2018-10-28 DIAGNOSIS — D120 Benign neoplasm of cecum: Secondary | ICD-10-CM | POA: Diagnosis not present

## 2018-10-28 DIAGNOSIS — Z1211 Encounter for screening for malignant neoplasm of colon: Secondary | ICD-10-CM | POA: Diagnosis not present

## 2018-10-28 DIAGNOSIS — D122 Benign neoplasm of ascending colon: Secondary | ICD-10-CM

## 2018-10-28 DIAGNOSIS — D125 Benign neoplasm of sigmoid colon: Secondary | ICD-10-CM

## 2018-10-28 MED ORDER — SODIUM CHLORIDE 0.9 % IV SOLN: 500.0000 mL | Freq: Once | INTRAVENOUS | Status: DC

## 2018-10-28 NOTE — Progress Notes (Signed)
Pt coughs and vomits light green liquid. Sx immediately. A/W patent trhroughout.

## 2018-10-28 NOTE — Progress Notes (Signed)
Pt's states no medical or surgical changes since previsit or office visit. 

## 2018-10-28 NOTE — Patient Instructions (Signed)
Discharge instructions given. Handouts on polyps,diverticulosis and hemorrhoids. Resume previous medications. YOU HAD AN ENDOSCOPIC PROCEDURE TODAY AT THE [facility]:   Refer to the procedure report that was given to you for any specific questions about what was found during the examination.  If the procedure report does not answer your questions, please call your gastroenterologist to clarify.  If you requested that your care partner not be given the details of your procedure findings, then the procedure report has been included in a sealed envelope for you to review at your convenience later.  YOU SHOULD EXPECT: Some feelings of bloating in the abdomen. Passage of more gas than usual.  Walking can help get rid of the air that was put into your GI tract during the procedure and reduce the bloating. If you had a lower endoscopy (such as a colonoscopy or flexible sigmoidoscopy) you may notice spotting of blood in your stool or on the toilet paper. If you underwent a bowel prep for your procedure, you may not have a normal bowel movement for a few days.  Please Note:  You might notice some irritation and congestion in your nose or some drainage.  This is from the oxygen used during your procedure.  There is no need for concern and it should clear up in a day or so.  SYMPTOMS TO REPORT IMMEDIATELY:   Following lower endoscopy (colonoscopy or flexible sigmoidoscopy):  Excessive amounts of blood in the stool  Significant tenderness or worsening of abdominal pains  Swelling of the abdomen that is new, acute  Fever of 100F or higher   For urgent or emergent issues, a gastroenterologist can be reached at any hour by calling [phone].   DIET:  We do recommend a small meal at first, but then you may proceed to your regular diet.  Drink plenty of fluids but you should avoid alcoholic beverages for 24 hours.  ACTIVITY:  You should plan to take it easy for the rest of today and you  should NOT DRIVE or use heavy machinery until tomorrow (because of the sedation medicines used during the test).    FOLLOW UP: Our staff will call the number listed on your records the next business day following your procedure to check on you and address any questions or concerns that you may have regarding the information given to you following your procedure. If we do not reach you, we will leave a message.  However, if you are feeling well and you are not experiencing any problems, there is no need to return our call.  We will assume that you have returned to your regular daily activities without incident.  If any biopsies were taken you will be contacted by phone or by letter within the next 1-3 weeks.  Please call us at [phone] if you have not heard about the biopsies in 3 weeks.    SIGNATURES/CONFIDENTIALITY: You and/or your care partner have signed paperwork which will be entered into your electronic medical record.  These signatures attest to the fact that that the information above on your After Visit Summary has been reviewed and is understood.  Full responsibility of the confidentiality of this discharge information lies with you and/or your care-partner. 

## 2018-10-28 NOTE — Progress Notes (Signed)
Called to room to assist during endoscopic procedure.  Patient ID and intended procedure confirmed with present staff. Received instructions for my participation in the procedure from the performing physician.  

## 2018-10-28 NOTE — Progress Notes (Signed)
To recovery, report to RN, VSS. 

## 2018-10-28 NOTE — Op Note (Signed)
[place] Patient Name: Jessica Middleton Procedure Date: 10/28/2018 9:41 AM MRN: [id number] Endoscopist: Mauri Pole , MD Age: 62 Referring MD:  Date of Birth: 05-20-1956 Gender: Female Account #: [id number] Procedure:                Colonoscopy Indications:              Screening for colorectal malignant neoplasm, Last                            colonoscopy: 2009 Medicines:                Monitored Anesthesia Care Procedure:                Pre-Anesthesia Assessment:                           - Prior to the procedure, a History and Physical                            was performed, and patient medications and                            allergies were reviewed. The patient's tolerance of                            previous anesthesia was also reviewed. The risks                            and benefits of the procedure and the sedation                            options and risks were discussed with the patient.                            All questions were answered, and informed consent                            was obtained. Prior Anticoagulants: The patient has                            taken no previous anticoagulant or antiplatelet                            agents. ASA Grade Assessment: II - A patient with                            mild systemic disease. After reviewing the risks                            and benefits, the patient was deemed in                            satisfactory condition to undergo the procedure.  After obtaining informed consent, the colonoscope                            was passed under direct vision. Throughout the                            procedure, the patient's blood pressure, pulse, and                            oxygen saturations were monitored continuously. The                            Colonoscope was introduced through the anus and                            advanced to the the cecum, identified  by                            appendiceal orifice and ileocecal valve. The                            colonoscopy was performed without difficulty. The                            patient tolerated the procedure well. The quality                            of the bowel preparation was excellent. The                            ileocecal valve, appendiceal orifice, and rectum                            were photographed. Scope In: 2:35:57 AM Scope Out: 10:11:54 AM Scope Withdrawal Time: 0 hours 11 minutes 13 seconds  Total Procedure Duration: 0 hours 24 minutes 55 seconds  Findings:                 The perianal and digital rectal examinations were                            normal.                           A 2 mm polyp was found in the cecum. The polyp was                            sessile. The polyp was removed with a cold biopsy                            forceps. Resection and retrieval were complete.                           Three sessile polyps were found in the sigmoid  colon and ascending colon. The polyps were 6 to 9                            mm in size. These polyps were removed with a cold                            snare. Resection and retrieval were complete.                           Scattered small and large-mouthed diverticula were                            found in the sigmoid colon, descending colon,                            transverse colon and ascending colon.                           Non-bleeding internal hemorrhoids were found during                            retroflexion. The hemorrhoids were small. Complications:            No immediate complications. Estimated Blood Loss:     Estimated blood loss was minimal. Impression:               - One 2 mm polyp in the cecum, removed with a cold                            biopsy forceps. Resected and retrieved.                           - Three 6 to 9 mm polyps in the sigmoid colon and                             in the ascending colon, removed with a cold snare.                            Resected and retrieved.                           - Moderate diverticulosis in the sigmoid colon, in                            the descending colon, in the transverse colon and                            in the ascending colon.                           - Non-bleeding internal hemorrhoids. Recommendation:           - Patient has a contact number available for  emergencies. The signs and symptoms of potential                            delayed complications were discussed with the                            patient. Return to normal activities tomorrow.                            Written discharge instructions were provided to the                            patient.                           - Resume previous diet.                           - Continue present medications.                           - Await pathology results.                           - Repeat colonoscopy in 3 years for surveillance                            based on pathology results. Mauri Pole, MD 10/28/2018 10:16:00 AM This report has been signed electronically.

## 2018-10-29 ENCOUNTER — Telehealth: Payer: Self-pay | Admitting: *Deleted

## 2018-10-29 NOTE — Telephone Encounter (Signed)
  Follow up Call-  Call back number 10/28/2018  Post procedure Call Back phone  # [phone]  Permission to leave phone message Yes  Some recent data might be hidden     Patient questions:  Do you have a fever, pain , or abdominal swelling? No. Pain Score  0 *  Have you tolerated food without any problems? Yes.    Have you been able to return to your normal activities? Yes.    Do you have any questions about your discharge instructions: Diet   No. Medications  No. Follow up visit  No.  Do you have questions or concerns about your Care? No.  Actions: * If pain score is 4 or above: No action needed, pain <4.

## 2018-11-04 ENCOUNTER — Encounter: Payer: Self-pay | Admitting: Gastroenterology

## 2019-02-10 DIAGNOSIS — R82998 Other abnormal findings in urine: Secondary | ICD-10-CM | POA: Diagnosis not present

## 2019-02-10 DIAGNOSIS — Z0000 Encounter for general adult medical examination without abnormal findings: Secondary | ICD-10-CM | POA: Diagnosis not present

## 2019-02-10 DIAGNOSIS — M859 Disorder of bone density and structure, unspecified: Secondary | ICD-10-CM | POA: Diagnosis not present

## 2019-02-10 DIAGNOSIS — E7849 Other hyperlipidemia: Secondary | ICD-10-CM | POA: Diagnosis not present

## 2019-02-17 DIAGNOSIS — Z0000 Encounter for general adult medical examination without abnormal findings: Secondary | ICD-10-CM | POA: Diagnosis not present

## 2019-02-17 DIAGNOSIS — Z1331 Encounter for screening for depression: Secondary | ICD-10-CM | POA: Diagnosis not present

## 2019-02-17 DIAGNOSIS — R030 Elevated blood-pressure reading, without diagnosis of hypertension: Secondary | ICD-10-CM | POA: Diagnosis not present

## 2019-02-17 DIAGNOSIS — Z1212 Encounter for screening for malignant neoplasm of rectum: Secondary | ICD-10-CM | POA: Diagnosis not present

## 2019-02-17 DIAGNOSIS — E7849 Other hyperlipidemia: Secondary | ICD-10-CM | POA: Diagnosis not present

## 2019-02-17 DIAGNOSIS — M859 Disorder of bone density and structure, unspecified: Secondary | ICD-10-CM | POA: Diagnosis not present

## 2019-02-17 DIAGNOSIS — R011 Cardiac murmur, unspecified: Secondary | ICD-10-CM | POA: Diagnosis not present

## 2019-02-19 ENCOUNTER — Other Ambulatory Visit (HOSPITAL_COMMUNITY): Payer: Self-pay | Admitting: Internal Medicine

## 2019-02-19 ENCOUNTER — Other Ambulatory Visit: Payer: Self-pay | Admitting: Internal Medicine

## 2019-02-19 DIAGNOSIS — R011 Cardiac murmur, unspecified: Secondary | ICD-10-CM

## 2019-02-26 ENCOUNTER — Ambulatory Visit (HOSPITAL_COMMUNITY): Payer: BLUE CROSS/BLUE SHIELD | Attending: Cardiovascular Disease

## 2019-02-26 ENCOUNTER — Other Ambulatory Visit: Payer: Self-pay

## 2019-02-26 DIAGNOSIS — R011 Cardiac murmur, unspecified: Secondary | ICD-10-CM | POA: Diagnosis not present

## 2019-04-25 DIAGNOSIS — R531 Weakness: Secondary | ICD-10-CM | POA: Diagnosis not present

## 2019-04-25 DIAGNOSIS — Z20828 Contact with and (suspected) exposure to other viral communicable diseases: Secondary | ICD-10-CM | POA: Diagnosis not present

## 2019-04-25 DIAGNOSIS — R05 Cough: Secondary | ICD-10-CM | POA: Diagnosis not present

## 2019-04-25 DIAGNOSIS — R0602 Shortness of breath: Secondary | ICD-10-CM | POA: Diagnosis not present

## 2019-04-25 DIAGNOSIS — Z20818 Contact with and (suspected) exposure to other bacterial communicable diseases: Secondary | ICD-10-CM | POA: Diagnosis not present

## 2019-06-16 ENCOUNTER — Other Ambulatory Visit: Payer: Self-pay | Admitting: Internal Medicine

## 2019-06-16 DIAGNOSIS — Z1231 Encounter for screening mammogram for malignant neoplasm of breast: Secondary | ICD-10-CM

## 2019-06-18 DIAGNOSIS — L503 Dermatographic urticaria: Secondary | ICD-10-CM | POA: Diagnosis not present

## 2019-06-18 DIAGNOSIS — L509 Urticaria, unspecified: Secondary | ICD-10-CM | POA: Diagnosis not present

## 2019-07-28 ENCOUNTER — Ambulatory Visit
Admission: RE | Admit: 2019-07-28 | Discharge: 2019-07-28 | Disposition: A | Discharge status: Home / Self Care | Payer: BLUE CROSS/BLUE SHIELD | Source: Ambulatory Visit | Attending: Internal Medicine | Admitting: Internal Medicine

## 2019-07-28 ENCOUNTER — Other Ambulatory Visit: Payer: Self-pay

## 2019-07-28 DIAGNOSIS — Z1231 Encounter for screening mammogram for malignant neoplasm of breast: Secondary | ICD-10-CM

## 2019-08-15 DIAGNOSIS — L738 Other specified follicular disorders: Secondary | ICD-10-CM | POA: Diagnosis not present

## 2019-08-15 DIAGNOSIS — D2261 Melanocytic nevi of right upper limb, including shoulder: Secondary | ICD-10-CM | POA: Diagnosis not present

## 2019-08-15 DIAGNOSIS — L819 Disorder of pigmentation, unspecified: Secondary | ICD-10-CM | POA: Diagnosis not present

## 2019-08-15 DIAGNOSIS — D2371 Other benign neoplasm of skin of right lower limb, including hip: Secondary | ICD-10-CM | POA: Diagnosis not present

## 2019-09-04 DIAGNOSIS — Z23 Encounter for immunization: Secondary | ICD-10-CM | POA: Diagnosis not present

## 2019-09-08 DIAGNOSIS — H2513 Age-related nuclear cataract, bilateral: Secondary | ICD-10-CM | POA: Diagnosis not present

## 2019-09-08 DIAGNOSIS — H04123 Dry eye syndrome of bilateral lacrimal glands: Secondary | ICD-10-CM | POA: Diagnosis not present

## 2019-11-19 DIAGNOSIS — B078 Other viral warts: Secondary | ICD-10-CM | POA: Diagnosis not present

## 2019-12-17 DIAGNOSIS — Z6823 Body mass index (BMI) 23.0-23.9, adult: Secondary | ICD-10-CM | POA: Diagnosis not present

## 2019-12-17 DIAGNOSIS — Z8489 Family history of other specified conditions: Secondary | ICD-10-CM | POA: Insufficient documentation

## 2019-12-17 DIAGNOSIS — Z01419 Encounter for gynecological examination (general) (routine) without abnormal findings: Secondary | ICD-10-CM | POA: Diagnosis not present

## 2019-12-17 DIAGNOSIS — Z842 Family history of other diseases of the genitourinary system: Secondary | ICD-10-CM | POA: Diagnosis not present

## 2019-12-29 DIAGNOSIS — D259 Leiomyoma of uterus, unspecified: Secondary | ICD-10-CM | POA: Insufficient documentation

## 2019-12-29 DIAGNOSIS — Z842 Family history of other diseases of the genitourinary system: Secondary | ICD-10-CM | POA: Diagnosis not present

## 2020-03-24 DIAGNOSIS — M859 Disorder of bone density and structure, unspecified: Secondary | ICD-10-CM | POA: Diagnosis not present

## 2020-03-24 DIAGNOSIS — E7849 Other hyperlipidemia: Secondary | ICD-10-CM | POA: Diagnosis not present

## 2020-03-24 DIAGNOSIS — Z0000 Encounter for general adult medical examination without abnormal findings: Secondary | ICD-10-CM | POA: Diagnosis not present

## 2020-03-25 DIAGNOSIS — D1801 Hemangioma of skin and subcutaneous tissue: Secondary | ICD-10-CM | POA: Diagnosis not present

## 2020-03-25 DIAGNOSIS — L821 Other seborrheic keratosis: Secondary | ICD-10-CM | POA: Diagnosis not present

## 2020-03-25 DIAGNOSIS — N951 Menopausal and female climacteric states: Secondary | ICD-10-CM | POA: Diagnosis not present

## 2020-03-31 DIAGNOSIS — R82998 Other abnormal findings in urine: Secondary | ICD-10-CM | POA: Diagnosis not present

## 2020-03-31 DIAGNOSIS — R3121 Asymptomatic microscopic hematuria: Secondary | ICD-10-CM | POA: Diagnosis not present

## 2020-03-31 DIAGNOSIS — I351 Nonrheumatic aortic (valve) insufficiency: Secondary | ICD-10-CM | POA: Diagnosis not present

## 2020-03-31 DIAGNOSIS — I519 Heart disease, unspecified: Secondary | ICD-10-CM | POA: Diagnosis not present

## 2020-03-31 DIAGNOSIS — Z1331 Encounter for screening for depression: Secondary | ICD-10-CM | POA: Diagnosis not present

## 2020-03-31 DIAGNOSIS — Z0000 Encounter for general adult medical examination without abnormal findings: Secondary | ICD-10-CM | POA: Diagnosis not present

## 2020-03-31 DIAGNOSIS — M8580 Other specified disorders of bone density and structure, unspecified site: Secondary | ICD-10-CM | POA: Diagnosis not present

## 2020-04-06 ENCOUNTER — Other Ambulatory Visit (HOSPITAL_COMMUNITY): Payer: Self-pay | Admitting: Internal Medicine

## 2020-04-06 DIAGNOSIS — I351 Nonrheumatic aortic (valve) insufficiency: Secondary | ICD-10-CM

## 2020-04-07 DIAGNOSIS — Z1212 Encounter for screening for malignant neoplasm of rectum: Secondary | ICD-10-CM | POA: Diagnosis not present

## 2020-04-16 ENCOUNTER — Encounter (HOSPITAL_COMMUNITY): Payer: Self-pay | Admitting: Internal Medicine

## 2020-04-26 ENCOUNTER — Telehealth (HOSPITAL_COMMUNITY): Payer: Self-pay | Admitting: Internal Medicine

## 2020-04-26 NOTE — Telephone Encounter (Signed)
Just an FYI. We have made several attempts to contact this patient including sending a letter to schedule or reschedule their echocardiogram. We will be removing the patient from the echo [place].   04/16/20 Mailed letter  04/12/20 [facility] to schedule @ 3:05/[facility]  04/08/20 [facility] to schedule @ 11:12/[facility]  04/06/20 [facility] to schedule @ 3:58/LBW       Thank you

## 2020-05-12 IMAGING — MG DIGITAL SCREENING BILATERAL MAMMOGRAM WITH TOMO AND CAD
8 series · 9 of 24 positions shown · non-contrast
Comparison: Previous exam(s).

CLINICAL DATA: Screening.

EXAM:
DIGITAL SCREENING BILATERAL MAMMOGRAM WITH TOMO AND CAD

[L MLO synth-2D]
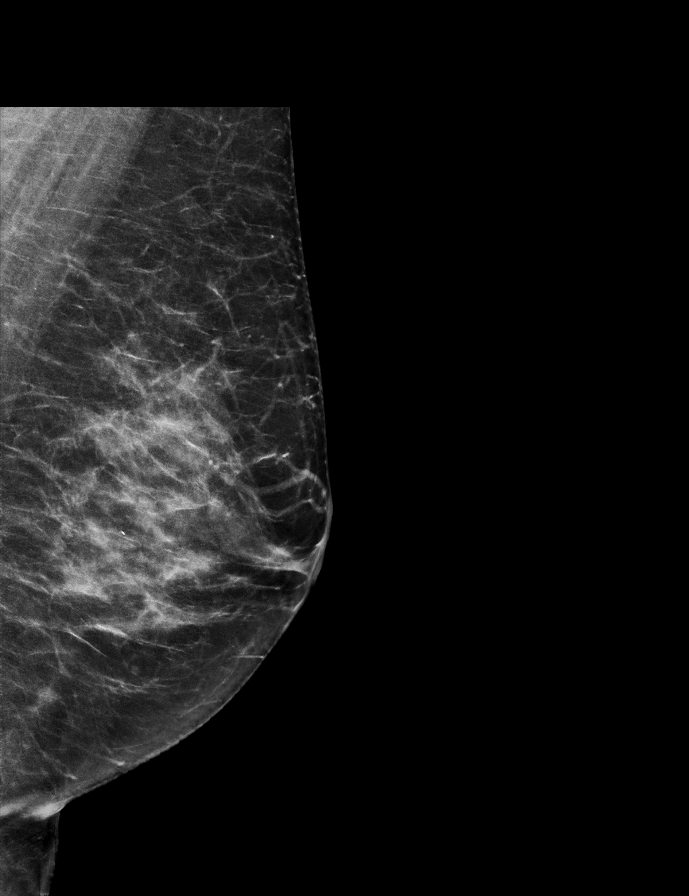

[L CC synth-2D]
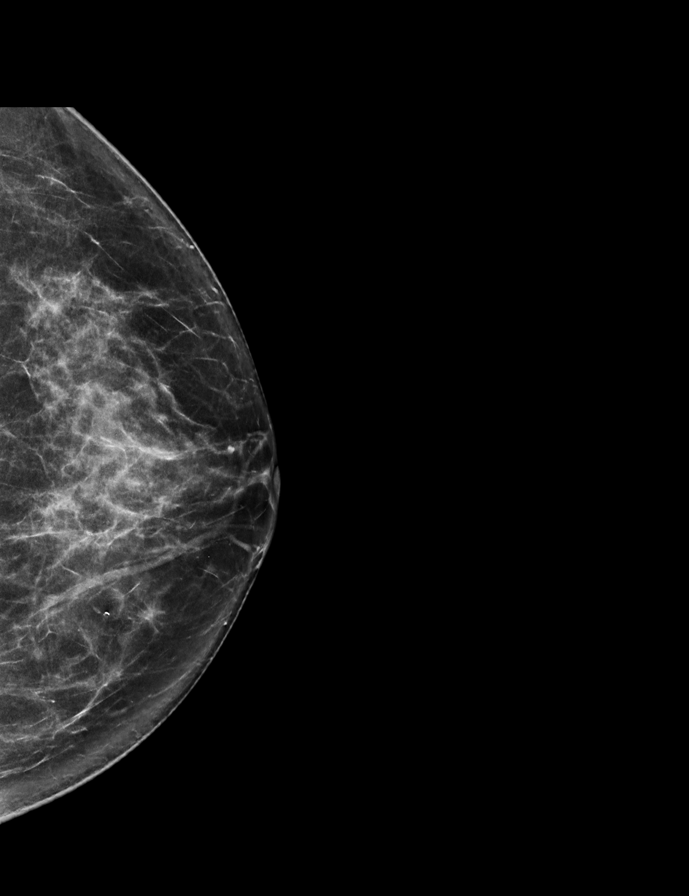

[R MLO synth-2D]
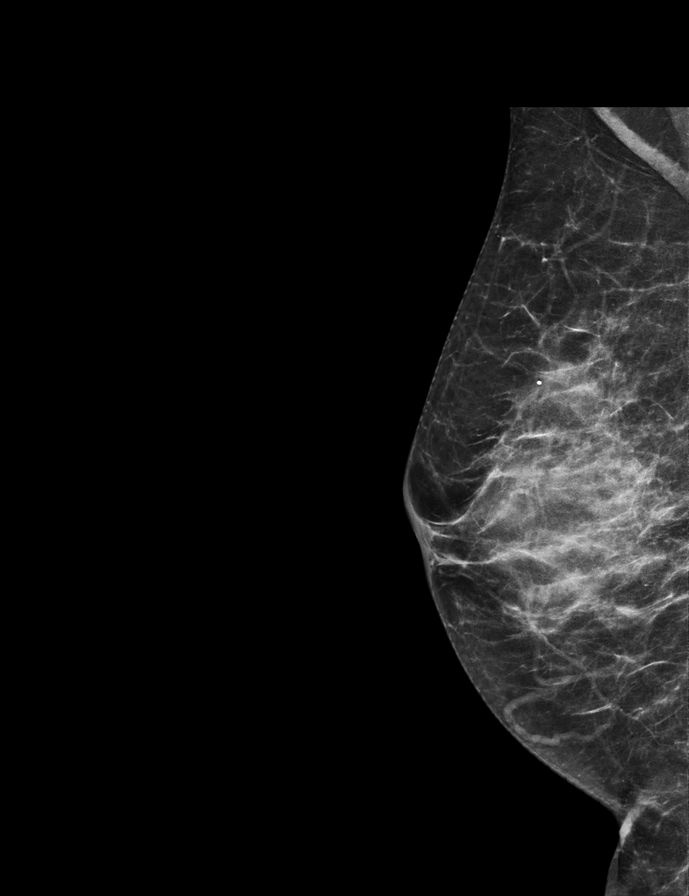

[R CC synth-2D]
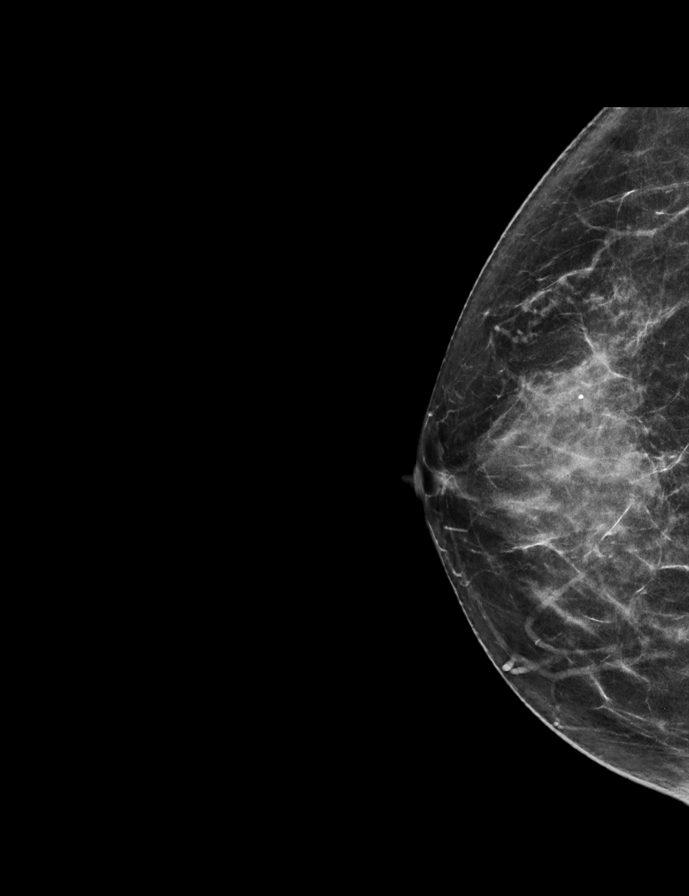

[R CC tomo · 2 of 62 frames shown]
[frame 21/62]
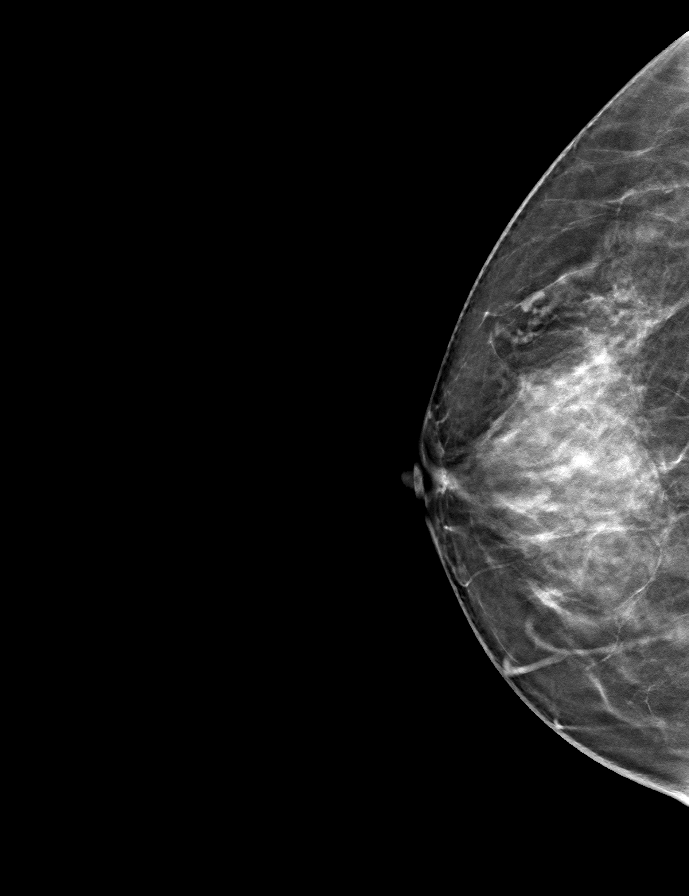
[frame 31/62]
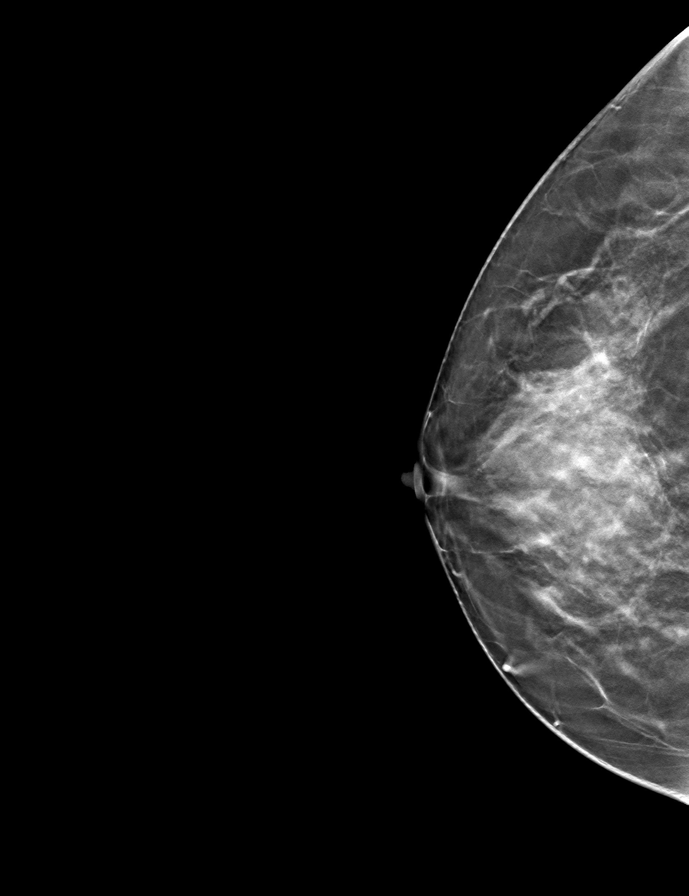

[R MLO tomo · tomo slice 31/60.0]
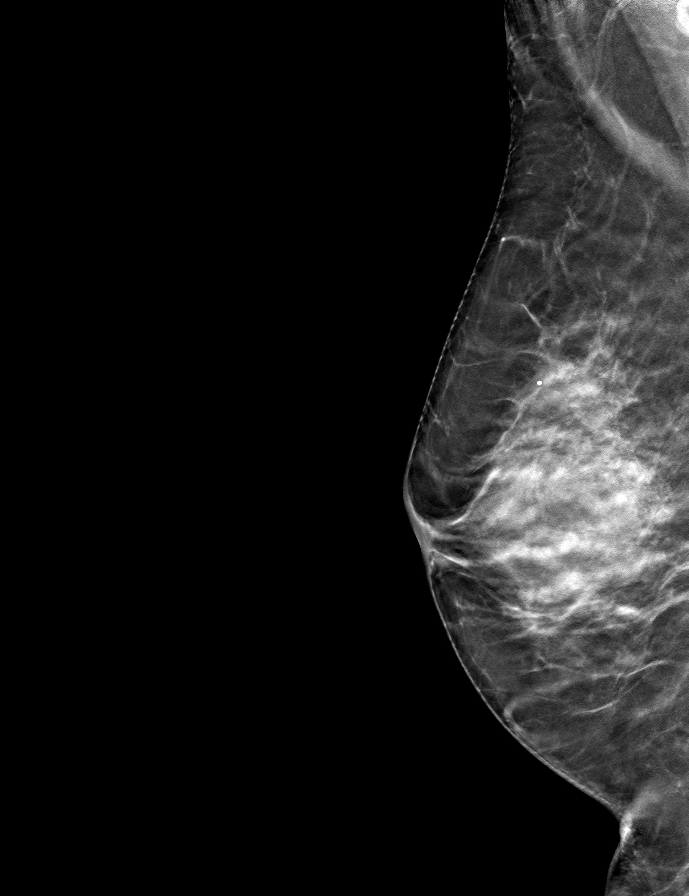

[L MLO tomo · tomo slice 33/66.0]
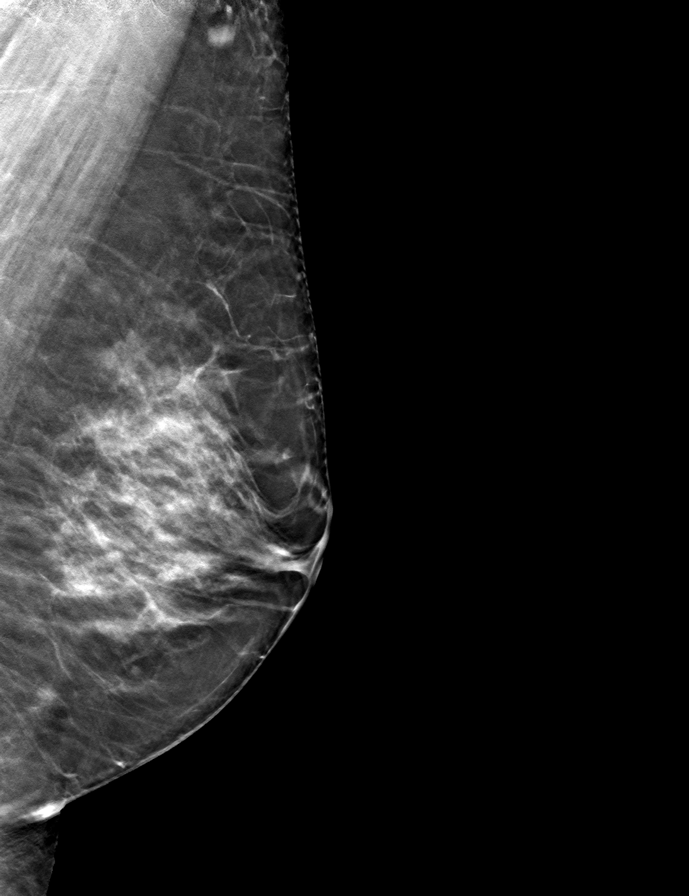

[L CC tomo · tomo slice 35/69.0]
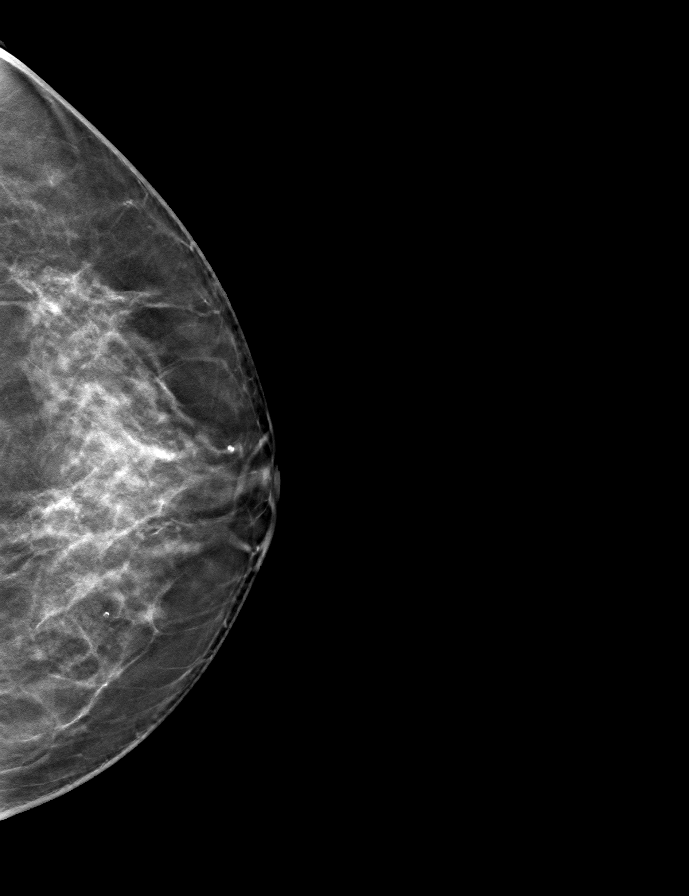

[9 of 24 positions shown; findings below may reference images not displayed]

ACR Breast Density Category c: The breast tissue is heterogeneously
dense, which may obscure small masses.
FINDINGS: There are no findings suspicious for malignancy. Images were
processed with CAD.
IMPRESSION: No mammographic evidence of malignancy. A result letter of this
screening mammogram will be mailed directly to the patient.

RECOMMENDATION:
Screening mammogram in one year. (Code:FT-U-LHB)

BI-RADS CATEGORY  1: Negative.

## 2020-05-19 ENCOUNTER — Other Ambulatory Visit: Payer: Self-pay

## 2020-05-19 ENCOUNTER — Ambulatory Visit (HOSPITAL_COMMUNITY): Payer: BC Managed Care – PPO | Attending: Cardiology

## 2020-05-19 DIAGNOSIS — I351 Nonrheumatic aortic (valve) insufficiency: Secondary | ICD-10-CM | POA: Diagnosis not present

## 2020-06-01 DIAGNOSIS — H5203 Hypermetropia, bilateral: Secondary | ICD-10-CM | POA: Diagnosis not present

## 2020-07-08 ENCOUNTER — Other Ambulatory Visit: Payer: Self-pay | Admitting: Internal Medicine

## 2020-07-08 DIAGNOSIS — Z1231 Encounter for screening mammogram for malignant neoplasm of breast: Secondary | ICD-10-CM

## 2020-08-09 ENCOUNTER — Other Ambulatory Visit: Payer: Self-pay

## 2020-08-09 ENCOUNTER — Ambulatory Visit
Admission: RE | Admit: 2020-08-09 | Discharge: 2020-08-09 | Disposition: A | Discharge status: Home / Self Care | Payer: BC Managed Care – PPO | Source: Ambulatory Visit | Attending: Internal Medicine | Admitting: Internal Medicine

## 2020-08-09 DIAGNOSIS — Z1231 Encounter for screening mammogram for malignant neoplasm of breast: Secondary | ICD-10-CM | POA: Diagnosis not present

## 2020-08-22 DIAGNOSIS — Z20828 Contact with and (suspected) exposure to other viral communicable diseases: Secondary | ICD-10-CM | POA: Diagnosis not present

## 2020-10-06 DIAGNOSIS — Z23 Encounter for immunization: Secondary | ICD-10-CM | POA: Diagnosis not present

## 2020-11-03 DIAGNOSIS — L814 Other melanin hyperpigmentation: Secondary | ICD-10-CM | POA: Diagnosis not present

## 2020-11-03 DIAGNOSIS — D1801 Hemangioma of skin and subcutaneous tissue: Secondary | ICD-10-CM | POA: Diagnosis not present

## 2020-11-03 DIAGNOSIS — D2261 Melanocytic nevi of right upper limb, including shoulder: Secondary | ICD-10-CM | POA: Diagnosis not present

## 2020-11-03 DIAGNOSIS — L821 Other seborrheic keratosis: Secondary | ICD-10-CM | POA: Diagnosis not present

## 2020-11-03 DIAGNOSIS — L819 Disorder of pigmentation, unspecified: Secondary | ICD-10-CM | POA: Diagnosis not present

## 2021-03-18 DIAGNOSIS — M7501 Adhesive capsulitis of right shoulder: Secondary | ICD-10-CM | POA: Diagnosis not present

## 2021-03-24 DIAGNOSIS — Z1151 Encounter for screening for human papillomavirus (HPV): Secondary | ICD-10-CM | POA: Diagnosis not present

## 2021-03-24 DIAGNOSIS — Z1272 Encounter for screening for malignant neoplasm of vagina: Secondary | ICD-10-CM | POA: Diagnosis not present

## 2021-03-24 DIAGNOSIS — Z8041 Family history of malignant neoplasm of ovary: Secondary | ICD-10-CM | POA: Diagnosis not present

## 2021-03-24 DIAGNOSIS — Z6823 Body mass index (BMI) 23.0-23.9, adult: Secondary | ICD-10-CM | POA: Diagnosis not present

## 2021-03-24 DIAGNOSIS — Z01419 Encounter for gynecological examination (general) (routine) without abnormal findings: Secondary | ICD-10-CM | POA: Diagnosis not present

## 2021-03-25 DIAGNOSIS — Z1151 Encounter for screening for human papillomavirus (HPV): Secondary | ICD-10-CM | POA: Diagnosis not present

## 2021-03-25 DIAGNOSIS — M7501 Adhesive capsulitis of right shoulder: Secondary | ICD-10-CM | POA: Diagnosis not present

## 2021-04-07 DIAGNOSIS — M859 Disorder of bone density and structure, unspecified: Secondary | ICD-10-CM | POA: Diagnosis not present

## 2021-04-07 DIAGNOSIS — E785 Hyperlipidemia, unspecified: Secondary | ICD-10-CM | POA: Diagnosis not present

## 2021-04-14 DIAGNOSIS — R82998 Other abnormal findings in urine: Secondary | ICD-10-CM | POA: Diagnosis not present

## 2021-04-14 DIAGNOSIS — Z0000 Encounter for general adult medical examination without abnormal findings: Secondary | ICD-10-CM | POA: Diagnosis not present

## 2021-04-14 DIAGNOSIS — E785 Hyperlipidemia, unspecified: Secondary | ICD-10-CM | POA: Diagnosis not present

## 2021-04-14 DIAGNOSIS — Z1331 Encounter for screening for depression: Secondary | ICD-10-CM | POA: Diagnosis not present

## 2021-05-25 ENCOUNTER — Other Ambulatory Visit: Payer: Self-pay | Admitting: Internal Medicine

## 2021-05-25 DIAGNOSIS — Z1231 Encounter for screening mammogram for malignant neoplasm of breast: Secondary | ICD-10-CM

## 2021-06-27 DIAGNOSIS — H5203 Hypermetropia, bilateral: Secondary | ICD-10-CM | POA: Diagnosis not present

## 2021-08-05 ENCOUNTER — Encounter: Payer: Self-pay | Admitting: Gastroenterology

## 2021-08-10 ENCOUNTER — Ambulatory Visit
Admission: RE | Admit: 2021-08-10 | Discharge: 2021-08-10 | Disposition: A | Discharge status: Home / Self Care | Payer: BC Managed Care – PPO | Source: Ambulatory Visit | Attending: Internal Medicine | Admitting: Internal Medicine

## 2021-08-10 ENCOUNTER — Other Ambulatory Visit: Payer: Self-pay

## 2021-08-10 DIAGNOSIS — Z1231 Encounter for screening mammogram for malignant neoplasm of breast: Secondary | ICD-10-CM

## 2021-08-29 ENCOUNTER — Other Ambulatory Visit: Payer: Self-pay

## 2021-08-29 ENCOUNTER — Ambulatory Visit (INDEPENDENT_AMBULATORY_CARE_PROVIDER_SITE_OTHER): Payer: BC Managed Care – PPO

## 2021-08-29 ENCOUNTER — Ambulatory Visit: Payer: BC Managed Care – PPO | Admitting: Podiatry

## 2021-08-29 DIAGNOSIS — N898 Other specified noninflammatory disorders of vagina: Secondary | ICD-10-CM | POA: Insufficient documentation

## 2021-08-29 DIAGNOSIS — M2011 Hallux valgus (acquired), right foot: Secondary | ICD-10-CM | POA: Diagnosis not present

## 2021-08-29 DIAGNOSIS — N760 Acute vaginitis: Secondary | ICD-10-CM | POA: Insufficient documentation

## 2021-08-29 DIAGNOSIS — B9689 Other specified bacterial agents as the cause of diseases classified elsewhere: Secondary | ICD-10-CM | POA: Insufficient documentation

## 2021-08-29 NOTE — Progress Notes (Signed)
   Subjective: 65 y.o. 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  Patient states it is not painful.  She is concerned because her mother had bunion deformities which are severe and she has to wear certain shoes.  She would like to have her bunions evaluated and discussed conservative treatment options   Past Medical History:  Diagnosis Date   Anxiety    Gilbert syndrome    Heart murmur    Osteopenia       Objective: Physical Exam General: The patient is alert and oriented x3 in no acute distress.  Dermatology: Skin is cool, dry and supple bilateral lower extremities. Negative for open lesions or macerations.  Vascular: Palpable pedal pulses bilaterally. No edema or erythema noted. Capillary refill within normal limits.  Neurological: Epicritic and protective threshold grossly intact bilaterally.   Musculoskeletal Exam: Clinical evidence of bunion deformity noted to the respective foot. There is moderate pain on palpation range of motion of the first MPJ. Lateral deviation of the hallux noted consistent with hallux abductovalgus.  Radiographic Exam: Increased intermetatarsal angle greater than 15 with a hallux abductus angle greater than 30 noted on AP view. Moderate degenerative changes noted within the first MPJ.  Assessment: 1. HAV w/ bunion deformity right   Plan of Care:  1. Patient was evaluated. X-Rays reviewed. 2.  Today we discussed the etiology of bunion deformities and associated treatments both conservative and surgical.   3.  Recommend wide fitting shoes and toe spacers as needed 4.  At the moment the bunion deformity is mostly asymptomatic.  Patient may continue full activity no restrictions 5.  Return to clinic as needed  *Retired      Edrick Kins, DPM [facility]  Dr. Edrick Kins, DPM    [address], [place] [address]                 Office [phone]  Fax [phone]

## 2021-08-30 ENCOUNTER — Encounter: Payer: Self-pay | Admitting: Podiatry

## 2021-10-07 DIAGNOSIS — R5383 Other fatigue: Secondary | ICD-10-CM | POA: Diagnosis not present

## 2021-10-07 DIAGNOSIS — J038 Acute tonsillitis due to other specified organisms: Secondary | ICD-10-CM | POA: Diagnosis not present

## 2021-10-07 DIAGNOSIS — J029 Acute pharyngitis, unspecified: Secondary | ICD-10-CM | POA: Diagnosis not present

## 2021-10-18 ENCOUNTER — Ambulatory Visit (AMBULATORY_SURGERY_CENTER): Payer: BC Managed Care – PPO | Admitting: *Deleted

## 2021-10-18 ENCOUNTER — Other Ambulatory Visit: Payer: Self-pay

## 2021-10-18 VITALS — Ht 62.0 in | Wt 126.0 lb

## 2021-10-18 DIAGNOSIS — Z86010 Personal history of colonic polyps: Secondary | ICD-10-CM

## 2021-10-18 MED ORDER — NA SULFATE-K SULFATE-MG SULF 17.5-3.13-1.6 GM/177ML PO SOLN: 1.0000 | Freq: Once | ORAL | 0 refills | Status: AC

## 2021-10-18 NOTE — Progress Notes (Signed)
No egg or soy allergy known to patient  No issues known to pt with past sedation with any surgeries or procedures Patient denies ever being told they had issues or difficulty with intubation  No FH of Malignant Hyperthermia Pt is not on diet pills Pt is not on  home 02  Pt is not on blood thinners  Pt denies issues with constipation  No A fib or A flutter  Pt is fully vaccinated  for Covid  NO PA's for preps discussed with pt In PV today  Discussed with pt there will be an out-of-pocket cost for prep and that varies from $0 to 70 +  dollars - pt verbalized understanding   Due to the COVID-19 pandemic we are asking patients to follow certain guidelines in PV and the [place]   Pt aware of COVID protocols and [facility] guidelines

## 2021-10-31 ENCOUNTER — Ambulatory Visit (AMBULATORY_SURGERY_CENTER): Payer: BC Managed Care – PPO | Admitting: Gastroenterology

## 2021-10-31 ENCOUNTER — Other Ambulatory Visit: Payer: Self-pay

## 2021-10-31 ENCOUNTER — Encounter: Payer: Self-pay | Admitting: Gastroenterology

## 2021-10-31 VITALS — BP 120/56 | HR 73 | Temp 97.5°F | Resp 15 | Ht 62.0 in | Wt 126.0 lb

## 2021-10-31 DIAGNOSIS — K635 Polyp of colon: Secondary | ICD-10-CM | POA: Diagnosis not present

## 2021-10-31 DIAGNOSIS — Z1211 Encounter for screening for malignant neoplasm of colon: Secondary | ICD-10-CM | POA: Diagnosis not present

## 2021-10-31 DIAGNOSIS — Z86010 Personal history of colonic polyps: Secondary | ICD-10-CM

## 2021-10-31 DIAGNOSIS — D120 Benign neoplasm of cecum: Secondary | ICD-10-CM | POA: Diagnosis not present

## 2021-10-31 DIAGNOSIS — D124 Benign neoplasm of descending colon: Secondary | ICD-10-CM | POA: Diagnosis not present

## 2021-10-31 MED ORDER — SODIUM CHLORIDE 0.9 % IV SOLN: 500.0000 mL | Freq: Once | INTRAVENOUS | Status: DC

## 2021-10-31 NOTE — Progress Notes (Signed)
Sedate, gd SR, tolerated procedure well, VSS, report to RN 

## 2021-10-31 NOTE — Progress Notes (Signed)
C.W. vital signs. 

## 2021-10-31 NOTE — Progress Notes (Signed)
[facility] History and Physical   Primary Care Physician:  Crist Infante, MD   Reason for Procedure:  History of adenomatous colon polyps  Plan:    Surveillance colonoscopy with possible interventions as needed     HPI: Jessica Middleton is a very pleasant 65 y.o. female here for screening colonoscopy. Denies any nausea, vomiting, abdominal pain, melena or bright red blood per rectum  The risks and benefits as well as alternatives of endoscopic procedure(s) have been discussed and reviewed. All questions answered. The patient agrees to proceed.    Past Medical History:  Diagnosis Date   Anxiety    Arthritis    Gilbert syndrome    Heart murmur    Osteopenia     Past Surgical History:  Procedure Laterality Date   BREAST BIOPSY Left    benign...yrs ago per pt   COLONOSCOPY     KNEE SURGERY Left    POLYPECTOMY      Prior to Admission medications   Medication Sig Start Date End Date Taking? Authorizing Provider  clonazePAM (KLONOPIN) 0.5 MG tablet Take 0.25-0.5 mg by mouth daily as needed. 08/03/21  Yes [provider]  Multiple Vitamin (MULTIVITAMIN) tablet Take 1 tablet by mouth daily.   Yes [provider]  fluticasone (FLONASE) 50 MCG/ACT nasal spray fluticasone propionate 50 mcg/actuation nasal spray,suspension  SHAKE LQ AND U 2 SPRAYS IEN D Patient not taking: No sig reported    [provider]  NEOMYCIN-POLYMYXIN-HYDROCORTISONE (CORTISPORIN) 1 % SOLN OTIC solution neomycin-polymyxin-hydrocort 3.5 mg/mL-10,000 unit/mL-1 % ear solution  INSTILL 4 DROPS TO AFFECTED EAR THREE TIMES DAILY FOR 10 DAYS Patient not taking: Reported on 10/31/2021    [provider]  Probiotic Product (ALIGN) 4 MG CAPS     [provider]    Current Outpatient Medications  Medication Sig Dispense Refill   clonazePAM (KLONOPIN) 0.5 MG tablet Take 0.25-0.5 mg by mouth daily as needed.     Multiple Vitamin (MULTIVITAMIN) tablet Take 1  tablet by mouth daily.     fluticasone (FLONASE) 50 MCG/ACT nasal spray fluticasone propionate 50 mcg/actuation nasal spray,suspension  SHAKE LQ AND U 2 SPRAYS IEN D (Patient not taking: No sig reported)     NEOMYCIN-POLYMYXIN-HYDROCORTISONE (CORTISPORIN) 1 % SOLN OTIC solution neomycin-polymyxin-hydrocort 3.5 mg/mL-10,000 unit/mL-1 % ear solution  INSTILL 4 DROPS TO AFFECTED EAR THREE TIMES DAILY FOR 10 DAYS (Patient not taking: Reported on 10/31/2021)     Probiotic Product (ALIGN) 4 MG CAPS      Current Facility-Administered Medications  Medication Dose Route Frequency Provider Last Rate Last Admin   0.9 %  sodium chloride infusion  500 mL Intravenous Once Mauri Pole, MD        Allergies as of 10/31/2021   (No Known Allergies)    Family History  Problem Relation Age of Onset   Heart disease Father    Lymphoma Father    Stomach cancer Paternal Grandfather    Colon cancer Neg Hx    Esophageal cancer Neg Hx    Rectal cancer Neg Hx    Colon polyps Neg Hx     Social History   Socioeconomic History   Marital status: Married    Spouse name: Not on file   Number of children: Not on file   Years of education: Not on file   Highest education level: Not on file  Occupational History   Not on file  Tobacco Use   Smoking status: Never   Smokeless tobacco: Never  Vaping Use   Vaping Use: Never used  Substance and Sexual Activity   Alcohol use: Yes    Alcohol/week: 7.0 standard drinks    Types: 7 Glasses of wine per week    Comment: occasionally   Drug use: Never   Sexual activity: Not on file  Other Topics Concern   Not on file  Social History Narrative   Not on file   Social Determinants of Health   Financial Resource Strain: Not on file  Food Insecurity: Not on file  Transportation Needs: Not on file  Physical Activity: Not on file  Stress: Not on file  Social Connections: Not on file  Intimate Partner Violence: Not on file    Review of Systems:  All  other review of systems negative except as mentioned in the HPI.  Physical Exam: Vital signs in last 24 hours: BP 131/77   Pulse 71   Temp (!) 97.5 F (36.4 C)   Ht 5\' 2"  (1.575 m)   Wt 126 lb (57.2 kg)   LMP 05/29/2011   SpO2 99%   BMI 23.05 kg/m     General:   Alert, NAD Lungs:  Clear .   Heart:  Regular rate and rhythm Abdomen:  Soft, nontender and nondistended. Neuro/Psych:  Alert and cooperative. Normal mood and affect. A and O x 3  Reviewed labs, radiology imaging, old records and pertinent past GI work up  Patient is appropriate for planned procedure(s) and anesthesia in an ambulatory setting   K. Denzil Magnuson , MD [phone]

## 2021-10-31 NOTE — Op Note (Signed)
[place] Patient Name: Jessica Middleton Procedure Date: 10/31/2021 7:34 AM MRN: [id number] Endoscopist: Mauri Pole , MD Age: 65 Referring MD:  Date of Birth: 21-Dec-1955 Gender: Female Account #: [id number] Procedure:                Colonoscopy Indications:              High risk colon cancer surveillance: Personal                            history of colonic polyps, High risk colon cancer                            surveillance: Personal history of multiple (3 or                            more) adenomas Medicines:                Monitored Anesthesia Care Procedure:                Pre-Anesthesia Assessment:                           - Prior to the procedure, a History and Physical                            was performed, and patient medications and                            allergies were reviewed. The patient's tolerance of                            previous anesthesia was also reviewed. The risks                            and benefits of the procedure and the sedation                            options and risks were discussed with the patient.                            All questions were answered, and informed consent                            was obtained. Prior Anticoagulants: The patient has                            taken no previous anticoagulant or antiplatelet                            agents. ASA Grade Assessment: II - A patient with                            mild systemic disease. After reviewing the risks  and benefits, the patient was deemed in                            satisfactory condition to undergo the procedure.                           After obtaining informed consent, the colonoscope                            was passed under direct vision. Throughout the                            procedure, the patient's blood pressure, pulse, and                            oxygen saturations were monitored  continuously. The                            [id number]-HQ190L Colonoscope was introduced through the                            anus and advanced to the the cecum, identified by                            appendiceal orifice and ileocecal valve. The                            colonoscopy was performed without difficulty. The                            patient tolerated the procedure well. The quality                            of the bowel preparation was excellent. The                            ileocecal valve, appendiceal orifice, and rectum                            were photographed. Scope In: 8:13:05 AM Scope Out: 8:31:55 AM Scope Withdrawal Time: 0 hours 14 minutes 52 seconds  Total Procedure Duration: 0 hours 18 minutes 50 seconds  Findings:                 The perianal and digital rectal examinations were                            normal.                           Two sessile polyps were found in the descending                            colon and cecum. The polyps were 4 to 9 mm in size.  These polyps were removed with a cold snare.                            Resection and retrieval were complete.                           Scattered small and large-mouthed diverticula were                            found in the sigmoid colon, descending colon and                            ascending colon.                           Non-bleeding internal hemorrhoids were found during                            retroflexion. The hemorrhoids were small. Complications:            No immediate complications. Estimated Blood Loss:     Estimated blood loss was minimal. Impression:               - Two 4 to 9 mm polyps in the descending colon and                            in the cecum, removed with a cold snare. Resected                            and retrieved.                           - Diverticulosis in the sigmoid colon, in the                            descending colon  and in the ascending colon.                           - Non-bleeding internal hemorrhoids. Recommendation:           - Patient has a contact number available for                            emergencies. The signs and symptoms of potential                            delayed complications were discussed with the                            patient. Return to normal activities tomorrow.                            Written discharge instructions were provided to the                            patient.                           -  Resume previous diet.                           - Continue present medications.                           - Await pathology results.                           - Repeat colonoscopy in 5 years for surveillance                            based on pathology results. Mauri Pole, MD 10/31/2021 8:40:33 AM This report has been signed electronically.

## 2021-10-31 NOTE — Progress Notes (Signed)
Called to room to assist during endoscopic procedure.  Patient ID and intended procedure confirmed with present staff. Received instructions for my participation in the procedure from the performing physician.  

## 2021-10-31 NOTE — Patient Instructions (Signed)
Handouts given for polyps, diverticulosis and high fiber diet.  YOU HAD AN ENDOSCOPIC PROCEDURE TODAY AT [facility]:   Refer to the procedure report that was given to you for any specific questions about what was found during the examination.  If the procedure report does not answer your questions, please call your gastroenterologist to clarify.  If you requested that your care partner not be given the details of your procedure findings, then the procedure report has been included in a sealed envelope for you to review at your convenience later.  YOU SHOULD EXPECT: Some feelings of bloating in the abdomen. Passage of more gas than usual.  Walking can help get rid of the air that was put into your GI tract during the procedure and reduce the bloating. If you had a lower endoscopy (such as a colonoscopy or flexible sigmoidoscopy) you may notice spotting of blood in your stool or on the toilet paper. If you underwent a bowel prep for your procedure, you may not have a normal bowel movement for a few days.  Please Note:  You might notice some irritation and congestion in your nose or some drainage.  This is from the oxygen used during your procedure.  There is no need for concern and it should clear up in a day or so.  SYMPTOMS TO REPORT IMMEDIATELY:  Following lower endoscopy (colonoscopy or flexible sigmoidoscopy):  Excessive amounts of blood in the stool  Significant tenderness or worsening of abdominal pains  Swelling of the abdomen that is new, acute  Fever of 100F or higher  For urgent or emergent issues, a gastroenterologist can be reached at any hour by calling [phone]. Do not use MyChart messaging for urgent concerns.    DIET:  We do recommend a small meal at first, but then you may proceed to your regular diet.  Drink plenty of fluids but you should avoid alcoholic beverages for 24 hours.  ACTIVITY:  You should plan to take it easy for the rest of today and you  should NOT DRIVE or use heavy machinery until tomorrow (because of the sedation medicines used during the test).    FOLLOW UP: Our staff will call the number listed on your records 48-72 hours following your procedure to check on you and address any questions or concerns that you may have regarding the information given to you following your procedure. If we do not reach you, we will leave a message.  We will attempt to reach you two times.  During this call, we will ask if you have developed any symptoms of COVID 19. If you develop any symptoms (ie: fever, flu-like symptoms, shortness of breath, cough etc.) before then, please call [phone].  If you test positive for Covid 19 in the 2 weeks post procedure, please call and report this information to [place].    If any biopsies were taken you will be contacted by phone or by letter within the next 1-3 weeks.  Please call us at [phone] if you have not heard about the biopsies in 3 weeks.    SIGNATURES/CONFIDENTIALITY: You and/or your care partner have signed paperwork which will be entered into your electronic medical record.  These signatures attest to the fact that that the information above on your After Visit Summary has been reviewed and is understood.  Full responsibility of the confidentiality of this discharge information lies with you and/or your care-partner.

## 2021-10-31 NOTE — Progress Notes (Signed)
Pt's states no medical or surgical changes since previsit or office visit. 

## 2021-11-02 ENCOUNTER — Telehealth: Payer: Self-pay | Admitting: *Deleted

## 2021-11-02 NOTE — Telephone Encounter (Signed)
  Follow up Call-  Call back number 10/31/2021  Post procedure Call Back phone  # [phone]  Permission to leave phone message Yes  Some recent data might be hidden     Patient questions:  Do you have a fever, pain , or abdominal swelling? No. Pain Score  0 *  Have you tolerated food without any problems? Yes.    Have you been able to return to your normal activities? Yes.    Do you have any questions about your discharge instructions: Diet   No. Medications  No. Follow up visit  No.  Do you have questions or concerns about your Care? No.  Actions: * If pain score is 4 or above: No action needed, pain <4.  Have you developed a fever since your procedure? no  2.   Have you had an respiratory symptoms (SOB or cough) since your procedure? no  3.   Have you tested positive for COVID 19 since your procedure no  4.   Have you had any family members/close contacts diagnosed with the COVID 19 since your procedure?  no   If yes to any of these questions please route to Joylene John, RN and Joella Prince, RN

## 2021-11-18 ENCOUNTER — Encounter: Payer: Self-pay | Admitting: Gastroenterology

## 2021-12-22 DIAGNOSIS — L820 Inflamed seborrheic keratosis: Secondary | ICD-10-CM | POA: Diagnosis not present

## 2022-01-18 DEATH — deceased

## 2022-02-13 DIAGNOSIS — N8110 Cystocele, unspecified: Secondary | ICD-10-CM | POA: Diagnosis not present

## 2022-02-13 DIAGNOSIS — N814 Uterovaginal prolapse, unspecified: Secondary | ICD-10-CM | POA: Diagnosis not present

## 2022-02-22 DIAGNOSIS — L821 Other seborrheic keratosis: Secondary | ICD-10-CM | POA: Diagnosis not present

## 2022-02-22 DIAGNOSIS — L820 Inflamed seborrheic keratosis: Secondary | ICD-10-CM | POA: Diagnosis not present

## 2022-02-22 DIAGNOSIS — L918 Other hypertrophic disorders of the skin: Secondary | ICD-10-CM | POA: Diagnosis not present

## 2022-02-22 DIAGNOSIS — L570 Actinic keratosis: Secondary | ICD-10-CM | POA: Diagnosis not present

## 2022-02-22 DIAGNOSIS — L738 Other specified follicular disorders: Secondary | ICD-10-CM | POA: Diagnosis not present

## 2022-02-22 DIAGNOSIS — L218 Other seborrheic dermatitis: Secondary | ICD-10-CM | POA: Diagnosis not present

## 2022-04-05 DIAGNOSIS — D259 Leiomyoma of uterus, unspecified: Secondary | ICD-10-CM | POA: Diagnosis not present

## 2022-04-05 DIAGNOSIS — Z8041 Family history of malignant neoplasm of ovary: Secondary | ICD-10-CM | POA: Diagnosis not present

## 2022-04-05 DIAGNOSIS — Z842 Family history of other diseases of the genitourinary system: Secondary | ICD-10-CM | POA: Diagnosis not present

## 2022-04-05 DIAGNOSIS — Z01411 Encounter for gynecological examination (general) (routine) with abnormal findings: Secondary | ICD-10-CM | POA: Diagnosis not present

## 2022-04-27 DIAGNOSIS — L570 Actinic keratosis: Secondary | ICD-10-CM | POA: Diagnosis not present

## 2022-04-27 DIAGNOSIS — L218 Other seborrheic dermatitis: Secondary | ICD-10-CM | POA: Diagnosis not present

## 2022-04-27 DIAGNOSIS — L820 Inflamed seborrheic keratosis: Secondary | ICD-10-CM | POA: Diagnosis not present

## 2022-05-01 DIAGNOSIS — E785 Hyperlipidemia, unspecified: Secondary | ICD-10-CM | POA: Diagnosis not present

## 2022-05-01 DIAGNOSIS — M8589 Other specified disorders of bone density and structure, multiple sites: Secondary | ICD-10-CM | POA: Diagnosis not present

## 2022-05-01 DIAGNOSIS — M8580 Other specified disorders of bone density and structure, unspecified site: Secondary | ICD-10-CM | POA: Diagnosis not present

## 2022-05-09 DIAGNOSIS — Z23 Encounter for immunization: Secondary | ICD-10-CM | POA: Diagnosis not present

## 2022-05-09 DIAGNOSIS — D649 Anemia, unspecified: Secondary | ICD-10-CM | POA: Diagnosis not present

## 2022-05-09 DIAGNOSIS — Z1331 Encounter for screening for depression: Secondary | ICD-10-CM | POA: Diagnosis not present

## 2022-05-09 DIAGNOSIS — R3121 Asymptomatic microscopic hematuria: Secondary | ICD-10-CM | POA: Diagnosis not present

## 2022-05-09 DIAGNOSIS — Z0000 Encounter for general adult medical examination without abnormal findings: Secondary | ICD-10-CM | POA: Diagnosis not present

## 2022-05-09 DIAGNOSIS — R82998 Other abnormal findings in urine: Secondary | ICD-10-CM | POA: Diagnosis not present

## 2022-05-09 DIAGNOSIS — Z1339 Encounter for screening examination for other mental health and behavioral disorders: Secondary | ICD-10-CM | POA: Diagnosis not present

## 2022-06-07 DIAGNOSIS — N819 Female genital prolapse, unspecified: Secondary | ICD-10-CM | POA: Insufficient documentation

## 2022-06-12 DIAGNOSIS — C44319 Basal cell carcinoma of skin of other parts of face: Secondary | ICD-10-CM | POA: Diagnosis not present

## 2022-06-12 DIAGNOSIS — C4441 Basal cell carcinoma of skin of scalp and neck: Secondary | ICD-10-CM | POA: Diagnosis not present

## 2022-06-26 ENCOUNTER — Other Ambulatory Visit: Payer: Self-pay | Admitting: Internal Medicine

## 2022-06-26 DIAGNOSIS — Z1231 Encounter for screening mammogram for malignant neoplasm of breast: Secondary | ICD-10-CM

## 2022-06-29 DIAGNOSIS — C44319 Basal cell carcinoma of skin of other parts of face: Secondary | ICD-10-CM | POA: Diagnosis not present

## 2022-07-06 DIAGNOSIS — Z4802 Encounter for removal of sutures: Secondary | ICD-10-CM | POA: Diagnosis not present

## 2022-08-18 ENCOUNTER — Ambulatory Visit: Payer: BC Managed Care – PPO

## 2022-08-22 ENCOUNTER — Ambulatory Visit: Payer: BC Managed Care – PPO

## 2022-08-22 ENCOUNTER — Ambulatory Visit
Admission: RE | Admit: 2022-08-22 | Discharge: 2022-08-22 | Disposition: A | Discharge status: Home / Self Care | Payer: Medicare Other | Source: Ambulatory Visit | Attending: Internal Medicine | Admitting: Internal Medicine

## 2022-08-22 DIAGNOSIS — Z1231 Encounter for screening mammogram for malignant neoplasm of breast: Secondary | ICD-10-CM

## 2022-08-24 ENCOUNTER — Other Ambulatory Visit: Payer: Self-pay | Admitting: Internal Medicine

## 2022-08-24 DIAGNOSIS — R928 Other abnormal and inconclusive findings on diagnostic imaging of breast: Secondary | ICD-10-CM

## 2022-08-31 ENCOUNTER — Ambulatory Visit
Admission: RE | Admit: 2022-08-31 | Discharge: 2022-08-31 | Disposition: A | Discharge status: Home / Self Care | Payer: Medicare Other | Source: Ambulatory Visit | Attending: Internal Medicine | Admitting: Internal Medicine

## 2022-08-31 ENCOUNTER — Ambulatory Visit: Payer: Medicare Other

## 2022-08-31 DIAGNOSIS — R928 Other abnormal and inconclusive findings on diagnostic imaging of breast: Secondary | ICD-10-CM

## 2022-12-21 DIAGNOSIS — N393 Stress incontinence (female) (male): Secondary | ICD-10-CM | POA: Insufficient documentation

## 2022-12-21 DIAGNOSIS — N812 Incomplete uterovaginal prolapse: Secondary | ICD-10-CM | POA: Insufficient documentation

## 2023-06-12 ENCOUNTER — Other Ambulatory Visit: Payer: Self-pay | Admitting: Internal Medicine

## 2023-06-12 DIAGNOSIS — Z1231 Encounter for screening mammogram for malignant neoplasm of breast: Secondary | ICD-10-CM

## 2023-08-24 ENCOUNTER — Ambulatory Visit
Admission: RE | Admit: 2023-08-24 | Discharge: 2023-08-24 | Disposition: A | Discharge status: Home / Self Care | Payer: Medicare Other | Source: Ambulatory Visit | Attending: Internal Medicine | Admitting: Internal Medicine

## 2023-08-24 DIAGNOSIS — Z1231 Encounter for screening mammogram for malignant neoplasm of breast: Secondary | ICD-10-CM

## 2023-11-20 DIAGNOSIS — H60543 Acute eczematoid otitis externa, bilateral: Secondary | ICD-10-CM | POA: Insufficient documentation

## 2023-12-25 ENCOUNTER — Ambulatory Visit: Payer: Medicare Other | Admitting: Podiatry

## 2024-01-01 ENCOUNTER — Ambulatory Visit: Payer: Medicare Other | Admitting: Podiatrist

## 2024-01-17 ENCOUNTER — Ambulatory Visit (INDEPENDENT_AMBULATORY_CARE_PROVIDER_SITE_OTHER): Payer: Medicare Other

## 2024-01-17 ENCOUNTER — Ambulatory Visit (INDEPENDENT_AMBULATORY_CARE_PROVIDER_SITE_OTHER): Payer: Medicare Other | Admitting: Podiatry

## 2024-01-17 ENCOUNTER — Encounter: Payer: Self-pay | Admitting: Podiatry

## 2024-01-17 DIAGNOSIS — M2011 Hallux valgus (acquired), right foot: Secondary | ICD-10-CM

## 2024-01-17 DIAGNOSIS — M2012 Hallux valgus (acquired), left foot: Secondary | ICD-10-CM

## 2024-01-17 NOTE — Progress Notes (Signed)
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 Logan Bores last year for this and states that really not having any pain at this time.  She just does not want her feet in the [facility].  Objective: Vital signs stable alert and oriented x 3.  Pulses are palpable.  Neurologic sensorium is intact deep tendon reflexes are intact muscle strength is 5 out of 5 dorsiflexors plantar flexors inverters everters all intrinsic musculature is intact orthopedic evaluation demonstrates all joints distal to ankle full range of motion without crepitus very mild bunion deformities are noted otherwise the foot is rectus with weightbearing.  Radiographs taken today do not demonstrate any type of osseous abnormalities good bone mineralization 3 views bilateral foot very slight increase in the first intermetatarsal angle which is within normal limits right foot.  Does not demonstrate any type of osteoarthritic change at this point.  Assessment: Very mild very early bunion deformity right over left.  Plan: Discussed appropriate shoe gear discussed activity level maintaining good health maintain good circulation and activity as well as maintaining her vitamin D and calcium levels.  Follow-up as needed

## 2024-06-17 ENCOUNTER — Other Ambulatory Visit: Payer: Self-pay | Admitting: Internal Medicine

## 2024-06-17 DIAGNOSIS — Z1231 Encounter for screening mammogram for malignant neoplasm of breast: Secondary | ICD-10-CM

## 2024-07-18 ENCOUNTER — Other Ambulatory Visit: Payer: Self-pay | Admitting: Internal Medicine

## 2024-07-18 DIAGNOSIS — N644 Mastodynia: Secondary | ICD-10-CM

## 2024-08-25 ENCOUNTER — Ambulatory Visit
Admission: RE | Admit: 2024-08-25 | Discharge: 2024-08-25 | Disposition: A | Discharge status: Home / Self Care | Source: Ambulatory Visit | Attending: Internal Medicine | Admitting: Internal Medicine

## 2024-08-25 ENCOUNTER — Ambulatory Visit: Admission: RE | Admit: 2024-08-25 | Source: Ambulatory Visit

## 2024-08-25 ENCOUNTER — Ambulatory Visit

## 2024-08-25 DIAGNOSIS — N644 Mastodynia: Secondary | ICD-10-CM

## 2024-10-29 ENCOUNTER — Other Ambulatory Visit: Payer: Self-pay | Admitting: Internal Medicine

## 2024-10-29 DIAGNOSIS — E785 Hyperlipidemia, unspecified: Secondary | ICD-10-CM

## 2024-10-31 ENCOUNTER — Ambulatory Visit
Admission: RE | Admit: 2024-10-31 | Discharge: 2024-10-31 | Disposition: A | Discharge status: Home / Self Care | Source: Ambulatory Visit | Attending: Internal Medicine | Admitting: Internal Medicine

## 2024-10-31 DIAGNOSIS — E785 Hyperlipidemia, unspecified: Secondary | ICD-10-CM
